# Patient Record
Sex: Male | Born: 1970 | Race: Black or African American | Hispanic: No | Marital: Single | Smoking: Current every day smoker
Health system: Southern US, Community
[De-identification: ages and names within clinical notes are randomized; demographics above are authoritative.]

## PROBLEM LIST (undated history)

## (undated) HISTORY — PX: HERNIA REPAIR: SHX51

---

## 2013-01-31 ENCOUNTER — Encounter (HOSPITAL_COMMUNITY): Payer: Self-pay | Admitting: *Deleted

## 2013-01-31 ENCOUNTER — Emergency Department (HOSPITAL_COMMUNITY)
Admission: EM | Admit: 2013-01-31 | Discharge: 2013-01-31 | Disposition: A | Discharge status: Home / Self Care | Payer: Self-pay | Attending: Emergency Medicine | Admitting: Emergency Medicine

## 2013-01-31 DIAGNOSIS — L0501 Pilonidal cyst with abscess: Secondary | ICD-10-CM | POA: Insufficient documentation

## 2013-01-31 DIAGNOSIS — R7309 Other abnormal glucose: Secondary | ICD-10-CM | POA: Insufficient documentation

## 2013-01-31 DIAGNOSIS — F17200 Nicotine dependence, unspecified, uncomplicated: Secondary | ICD-10-CM | POA: Insufficient documentation

## 2013-01-31 LAB — BASIC METABOLIC PANEL
BUN: 7 mg/dL (ref 6–23)
GFR calc Af Amer: >90 mL/min (ref >90)
GFR calc non Af Amer: >90 mL/min (ref >90)
Potassium: 3.6 mEq/L (ref 3.5–5.1)
Sodium: 134 mEq/L — ABNORMAL LOW (ref 135–145)

## 2013-01-31 LAB — CBC WITH DIFFERENTIAL/PLATELET
Basophils Absolute: 0.1 10*3/uL (ref 0.0–0.1)
Basophils Relative: 0 % (ref 0–1)
Eosinophils Absolute: 0.2 10*3/uL (ref 0.0–0.7)
MCH: 29 pg (ref 26.0–34.0)
MCHC: 33.8 g/dL (ref 30.0–36.0)
Neutrophils Relative %: 73 % (ref 43–77)
Platelets: 393 10*3/uL (ref 150–400)
RDW: 12.6 % (ref 11.5–15.5)

## 2013-01-31 MED ORDER — LIDOCAINE-EPINEPHRINE 2 %-1:100000 IJ SOLN: 30.0000 mL | Freq: Once | INTRAMUSCULAR | Status: DC

## 2013-01-31 MED ORDER — MORPHINE SULFATE 4 MG/ML IJ SOLN
4.0000 mg | Freq: Once | INTRAMUSCULAR | Status: AC
  Administered 2013-01-31: 4 mg via INTRAVENOUS
  Filled 2013-01-31: qty 1

## 2013-01-31 MED ORDER — LIDOCAINE-EPINEPHRINE 1 %-1:100000 IJ SOLN
30.0000 mL | Freq: Once | INTRAMUSCULAR | Status: AC
  Administered 2013-01-31: 30 mL
  Filled 2013-01-31: qty 1

## 2013-01-31 MED ORDER — OXYCODONE-ACETAMINOPHEN 5-325 MG PO TABS: 1.0000 | ORAL_TABLET | ORAL | Status: DC | PRN

## 2013-01-31 MED ORDER — DOXYCYCLINE HYCLATE 50 MG PO CAPS: 50.0000 mg | ORAL_CAPSULE | Freq: Two times a day (BID) | ORAL | Status: DC

## 2013-01-31 NOTE — ED Notes (Signed)
Pt has large significant abscess to left inner buttocks area

## 2013-01-31 NOTE — ED Provider Notes (Signed)
History    CSN: [id number] Arrival date & time 01/31/13  1520  First MD Initiated Contact with Patient 01/31/13 1540     Chief Complaint  Patient presents with  . Abscess   (Consider location/radiation/quality/duration/timing/severity/associated sxs/prior Treatment) HPI Comments: Pt presents with left buttock abscess present for about 2 weeks. Lesion has grown larger and much more tender over the last two days. The lesion is firm with a central blister. Pt does not think it has had any spontaneous bleeding or drainage. He has taken ibuprofen for pain with some partial relief. Pt denies fever but endorses chills. Otherwise feels well. No N/V, change in bowel/bladder habits, abdominal pain, chest pain, SOB, cough, or production of sputum. Pt has no known medical history otherwise. Pt states about 10-12 years ago, he was sitting on a couch and got "stuck by a spring," then developed a similar abscess that needed incision and drainage, but has had no problems, since then.  The history is provided by the patient. No language interpreter was used.   History reviewed. No pertinent past medical history. Past Surgical History  Procedure Laterality Date  . Hernia repair     No family history on file. History  Substance Use Topics  . Smoking status: Current Every Day Smoker  . Smokeless tobacco: Not on file  . Alcohol Use: Yes     Comment: occ    Review of Systems  Constitutional: Positive for chills. Negative for fever, activity change and appetite change.  HENT: Negative for congestion, sore throat and rhinorrhea.   Eyes: Negative for pain and itching.  Respiratory: Negative for cough, chest tightness, shortness of breath and wheezing.   Cardiovascular: Negative for chest pain, palpitations and leg swelling.  Gastrointestinal: Negative for nausea, vomiting, abdominal pain, constipation, blood in stool and abdominal distention.  Endocrine: Negative for polydipsia and polyuria.    Genitourinary: Negative for dysuria, urgency, hematuria and flank pain.  Musculoskeletal: Positive for back pain. Negative for myalgias and joint swelling.       Pain around abscess  Skin:       Abscess to buttock, per HPI  Allergic/Immunologic: Negative for environmental allergies.  Neurological: Negative for dizziness, syncope, weakness, light-headedness and numbness.  Psychiatric/Behavioral: Negative for behavioral problems, confusion and sleep disturbance.    Allergies  Review of patient's allergies indicates no known allergies.  Home Medications   Current Outpatient Rx  Name  Route  Sig  Dispense  Refill  . doxycycline (VIBRAMYCIN) 50 MG capsule   Oral   Take 1 capsule (50 mg total) by mouth 2 (two) times daily.   20 capsule   0   . oxyCODONE-acetaminophen (PERCOCET/ROXICET) 5-325 MG per tablet   Oral   Take 1 tablet by mouth every 4 (four) hours as needed for pain.   30 tablet   0    BP 125/87  Pulse 105  Temp(Src) 99.3 F (37.4 C) (Oral)  Resp 24  SpO2 98% Physical Exam  Nursing note and vitals reviewed. Constitutional: He is oriented to person, place, and time. He appears well-developed and well-nourished. No distress.  HENT:  Head: Normocephalic and atraumatic.  Eyes: Conjunctivae are normal. Pupils are equal, round, and reactive to light.  Neck: Normal range of motion. Neck supple.  Cardiovascular: Normal rate, regular rhythm and normal heart sounds.   No murmur heard. Pulmonary/Chest: Effort normal and breath sounds normal. No respiratory distress. He has no wheezes.  Abdominal: Soft. Bowel sounds are normal. He exhibits no distension.  There is no tenderness.  Musculoskeletal: Normal range of motion. He exhibits no edema and no tenderness.  Neurological: He is alert and oriented to person, place, and time. No cranial nerve deficit. He exhibits normal muscle tone.  Skin: Skin is warm and dry. Lesion noted. He is not diaphoretic.     Psychiatric: He has  a normal mood and affect. His behavior is normal.   ED Course  INCISION AND DRAINAGE Date/Time: 01/31/2013 5:47 PM Performed by: Bobbye Morton Authorized by: Bobbye Morton Consent: Verbal consent obtained. Risks and benefits: risks, benefits and alternatives were discussed Consent given by: patient Patient understanding: patient states understanding of the procedure being performed Patient consent: the patient's understanding of the procedure matches consent given Procedure consent: procedure consent matches procedure scheduled Patient identity confirmed: verbally with patient and arm band Time out: Immediately prior to procedure a "time out" was called to verify the correct patient, procedure, equipment, support staff and site/side marked as required. Type: abscess Body area: anogenital Location details: pilonidal Anesthesia: local infiltration Local anesthetic: lidocaine 2% with epinephrine Patient sedated: no Scalpel size: 11 Needle gauge: 22 Incision type: single straight Complexity: simple Drainage: purulent and bloody Drainage amount: copious Wound treatment: wound left open Packing material: 1/2 in iodoform gauze (approximately 5 inches) Patient tolerance: Patient tolerated the procedure well with no immediate complications.   (including critical care time) Labs Reviewed  CBC WITH DIFFERENTIAL - Abnormal; Notable for the following:    WBC 14.3 (*)    Neutro Abs 10.4 (*)    All other components within normal limits  BASIC METABOLIC PANEL - Abnormal; Notable for the following:    Sodium 134 (*)    Glucose, Bld 222 (*)    All other components within normal limits   No results found. 1. Pilonidal abscess     MDM  42yo male with pilonidal cyst as described above. S/p I&D in the ED with no immediate complication. Instructed to f/u in 2 days at urgent care for wound check and packing removal. Rx for doxycycline for 10 days and Percocet PRN for pain.  Work note provided. Return precautions discussed. OF NOTE: pt was found to be hyperglycemic, but without any other symptoms. Recommended establishing care with a PCP in the area for further evaluation.  The above was discussed in its entirety with attending ED physician Dr. Denton Lank.   Bobbye Morton, MD  PGY-2, [facility]  Bobbye Morton, MD 01/31/13 [phone]

## 2013-02-02 ENCOUNTER — Encounter (HOSPITAL_COMMUNITY): Payer: Self-pay | Admitting: Emergency Medicine

## 2013-02-02 ENCOUNTER — Emergency Department (INDEPENDENT_AMBULATORY_CARE_PROVIDER_SITE_OTHER)
Admission: EM | Admit: 2013-02-02 | Discharge: 2013-02-02 | Disposition: A | Discharge status: Home / Self Care | Payer: Medicaid Other | Source: Home / Self Care | Attending: Emergency Medicine | Admitting: Emergency Medicine

## 2013-02-02 DIAGNOSIS — L0291 Cutaneous abscess, unspecified: Secondary | ICD-10-CM

## 2013-02-02 DIAGNOSIS — L0390 Cellulitis, unspecified: Secondary | ICD-10-CM

## 2013-02-02 MED ORDER — OXYCODONE-ACETAMINOPHEN 5-325 MG PO TABS: ORAL_TABLET | ORAL | Status: DC

## 2013-02-02 NOTE — ED Notes (Signed)
Pt is here for packing removal from his buttocks,  Packing was placed in the main ED 01/30/2013

## 2013-02-02 NOTE — ED Provider Notes (Signed)
I saw and evaluated the patient, reviewed the resident's note and I agree with the findings and plan. Pt with large buttock abscess, left buttock. Resident I and D'd, I was present throughout the procedure.  Recheck no remaining fluctuance. No necrotic tissue. No crepitus.   Suzi Roots, MD 02/02/13 [phone]

## 2013-02-02 NOTE — ED Provider Notes (Signed)
Chief Complaint:   Chief Complaint  Patient presents with  . Abscess    History of Present Illness:    Anthony Weiss is a 42 year old male who returns for removal of packing from an abscess on his buttock. This was incised and drained this past Monday and he has been on doxycycline ever since then. He feels it is improving and is less painful. The wound was not cultured. He has been leaving the packing in place and dressing it daily. He denies any fever or chills. No prior history of skin infections or MRSA.  Review of Systems:  Other than noted above, the patient denies any of the following symptoms: Systemic:  No fever, chills or sweats. Skin:  No rash or itching.  PMFSH:  Past medical history, family history, social history, meds, and allergies were reviewed.  No history of diabetes or prior history of abscesses or MRSA.   Physical Exam:   Vital signs:  BP 124/85  Pulse 111  Temp(Src) 98.4 F (36.9 C) (Oral)  Resp 22  SpO2 100% Skin:  There is an abscess on the buttock, adjacent to the intergluteal cleft on the right side. This has some packing in place. There is surrounding induration and tenderness to palpation.  Skin exam was otherwise normal.  No rash. Ext:  Distal pulses were full, patient has full ROM of all joints.  Procedure:  Verbal informed consent was obtained.  The patient was informed of the risks and benefits of the procedure and understands and accepts.  Identity of the patient was verified verbally and by wristband. The packing was removed. Only a small strip of packing remains. The wound cavity was flushed with saline, antibiotic ointment was applied and a sterile dressing. He was instructed in wound care.  Assessment:  The encounter diagnosis was Abscess.  This appears to be healing up well. Does not need to be repacked.  Plan:   1.  The following meds were prescribed:   Discharge Medication List as of 02/02/2013 12:07 PM    START taking these medications   Details  !! oxyCODONE-acetaminophen (PERCOCET) 5-325 MG per tablet 1 to 2 tablets every 6 hours as needed for pain., Print     !! - Potential duplicate medications found. Please discuss with provider.     2.  The patient was instructed in symptomatic care and handouts were given. 3.  The patient was instructed in wound care.  Given red flag symptoms such as worsening pain, swelling, or fever that would indicate earlier return.   Reuben Likes, MD 02/02/13 [phone]

## 2015-02-07 ENCOUNTER — Emergency Department (HOSPITAL_COMMUNITY): Payer: Commercial Managed Care - PPO

## 2015-02-07 ENCOUNTER — Encounter (HOSPITAL_COMMUNITY): Payer: Self-pay | Admitting: Emergency Medicine

## 2015-02-07 ENCOUNTER — Emergency Department (HOSPITAL_COMMUNITY)
Admission: EM | Admit: 2015-02-07 | Discharge: 2015-02-07 | Disposition: A | Discharge status: Home / Self Care | Payer: Commercial Managed Care - PPO | Attending: Emergency Medicine | Admitting: Emergency Medicine

## 2015-02-07 DIAGNOSIS — M79671 Pain in right foot: Secondary | ICD-10-CM | POA: Diagnosis present

## 2015-02-07 DIAGNOSIS — Z720 Tobacco use: Secondary | ICD-10-CM | POA: Diagnosis not present

## 2015-02-07 DIAGNOSIS — Z792 Long term (current) use of antibiotics: Secondary | ICD-10-CM | POA: Insufficient documentation

## 2015-02-07 MED ORDER — NAPROXEN 375 MG PO TABS: 375.0000 mg | ORAL_TABLET | Freq: Two times a day (BID) | ORAL | Status: DC

## 2015-02-07 NOTE — ED Provider Notes (Signed)
CSN: [id number]     Arrival date & time 02/07/15  1407 History  This chart was scribed for non-physician practitioner, Roxy Horseman, PA-C, working with Blane Ohara, MD by Charline Bills, ED Scribe. This patient was seen in room TR10C/TR10C and the patient's care was started at 2:37 PM.   Chief Complaint  Patient presents with  . Foot Pain   The history is provided by the patient. No language interpreter was used.   HPI Comments: Anthony Weiss is a 44 y.o. male who presents to the Emergency Department with a chief complaint of gradually worsening, intermittent right foot pain for the past year. Pt initially noticed the pain 1 year ago when he tripped down stairs in slide-on shoes. Pain is exacerbated with palpation and bearing weight. He also reports associated intermittent swelling to the affected area. Pt has tried Aleve and Tylenol without significant relief.   History reviewed. No pertinent past medical history. Past Surgical History  Procedure Laterality Date  . Hernia repair     No family history on file. History  Substance Use Topics  . Smoking status: Current Every Day Smoker -- 0.50 packs/day    Types: Cigarettes  . Smokeless tobacco: Not on file  . Alcohol Use: Yes     Comment: occ    Review of Systems  Constitutional: Negative for fever and chills.  Respiratory: Negative for shortness of breath.   Cardiovascular: Negative for chest pain.  Gastrointestinal: Negative for nausea, vomiting, diarrhea and constipation.  Genitourinary: Negative for dysuria.  Musculoskeletal: Positive for joint swelling and arthralgias.   Allergies  Review of patient's allergies indicates no known allergies.  Home Medications   Prior to Admission medications   Medication Sig Start Date End Date Taking? Authorizing Provider  doxycycline (VIBRAMYCIN) 50 MG capsule Take 1 capsule (50 mg total) by mouth 2 (two) times daily. 01/31/13   Stephanie Coup Street, MD  oxyCODONE-acetaminophen  (PERCOCET) 5-325 MG per tablet 1 to 2 tablets every 6 hours as needed for pain. 02/02/13   Reuben Likes, MD  oxyCODONE-acetaminophen (PERCOCET/ROXICET) 5-325 MG per tablet Take 1 tablet by mouth every 4 (four) hours as needed for pain. 01/31/13   Stephanie Coup Street, MD   BP 133/83 mmHg  Pulse 86  Temp(Src) 98 F (36.7 C) (Oral)  Resp 18  SpO2 99% Physical Exam  Constitutional: He is oriented to person, place, and time. He appears well-developed and well-nourished. No distress.  HENT:  Head: Normocephalic and atraumatic.  Eyes: Conjunctivae and EOM are normal.  Neck: Neck supple. No tracheal deviation present.  Cardiovascular: Normal rate and intact distal pulses.   Brisk cap refill.   Pulmonary/Chest: Effort normal. No respiratory distress.  Musculoskeletal: Normal range of motion.  R foot: moderate tenderness to palpation over lateral aspect. No bony abnormality or deformity. No signs of infectoion  or abscess. No erythema, warmth or sign of gout. ROM and strength 5/5. Able to ambulate.   Neurological: He is alert and oriented to person, place, and time.  Skin: Skin is warm and dry.  Psychiatric: He has a normal mood and affect. His behavior is normal.  Nursing note and vitals reviewed.  ED Course  Procedures (including critical care time) DIAGNOSTIC STUDIES: Oxygen Saturation is 99% on RA, normal by my interpretation.    COORDINATION OF CARE: 2:40 PM-Discussed treatment plan which includes XR with pt at bedside and pt agreed to plan.   Labs Review Labs Reviewed - No data to display  Imaging  Review Dg Foot Complete Right  02/07/2015   CLINICAL DATA:  Right foot swelling after fall down steps 1 year ago. Initial encounter.  EXAM: RIGHT FOOT COMPLETE - 3+ VIEW  COMPARISON:  None.  FINDINGS: There is no evidence of fracture or dislocation. There is no evidence of arthropathy or other focal bone abnormality. Soft tissues are unremarkable.  IMPRESSION: Normal right foot.    Electronically Signed   By: Lupita Raider, M.D.   On: 02/07/2015 15:42    EKG Interpretation None      MDM   Final diagnoses:  Right foot pain    Patient with right sided foot pain. Plain films are negative. Pain has been persistent for the past year. No evidence of infection. Intact distal pulses and brisk capillary refill. Will recommend outpatient follow-up with podiatry or orthopedics. Patient understands and agrees with the plan. He is stable and ready for discharge.  I personally performed the services described in this documentation, which was scribed in my presence. The recorded information has been reviewed and is accurate.     Troy Kanouse, PA-C 02/07/15 1605  Blane Ohara, MD 02/07/15 [phone]

## 2015-02-07 NOTE — Discharge Instructions (Signed)
Your x-rays in the emergency department are unremarkable. Please follow-up with a foot specialist.   Emergency Department Resource Guide 1) Find a Doctor and Pay Out of Pocket Although you won't have to find out who is covered by your insurance plan, it is a good idea to ask around and get recommendations. You will then need to call the office and see if the doctor you have chosen will accept you as a new patient and what types of options they offer for patients who are self-pay. Some doctors offer discounts or will set up payment plans for their patients who do not have insurance, but you will need to ask so you aren't surprised when you get to your appointment.  2) Contact Your Local Health Department Not all health departments have doctors that can see patients for sick visits, but many do, so it is worth a call to see if yours does. If you don't know where your local health department is, you can check in your phone book. The CDC also has a tool to help you locate your state's health department, and many state websites also have listings of all of their local health departments.  3) Find a Walk-in Clinic If your illness is not likely to be very severe or complicated, you may want to try a walk in clinic. These are popping up all over the country in pharmacies, drugstores, and shopping centers. They're usually staffed by nurse practitioners or physician assistants that have been trained to treat common illnesses and complaints. They're usually fairly quick and inexpensive. However, if you have serious medical issues or chronic medical problems, these are probably not your best option.  No Primary Care Doctor: - Call Health Connect at  [phone] - they can help you locate a primary care doctor that  accepts your insurance, provides certain services, etc. - Physician Referral Service- [phone]  Chronic Pain Problems: Organization         Address  Phone   Notes  [facility]  [phone] Patients need to be referred by their primary care doctor.   Medication Assistance: Organization         Address  Phone   Notes  [facility] Medication [facility] [address] [place] [place], [place] [address] [phone] --Must be a resident of [facility] -- Must have NO insurance coverage whatsoever (no Medicaid/ Medicare, etc.) -- The pt. MUST have a primary care doctor that directs their care regularly and follows them in the community   [facility]ist  [phone]   [employer]  [phone]    Agencies that provide inexpensive medical care: Organization         Address  Phone   Notes  [facility]  [phone]   [facility]    [phone]   [facility] [address] [place], [place] [address] [phone]   [facility] [address] [place] [place] [phone]   Planned Parenthood    [phone]   [facility]    [phone]   [facility]  [address], [place] Phone:  [phone], Fax:  [phone] Hours of Operation:  9 am - 6 pm, M-F.  Also accepts Medicaid/Medicare and self-pay.  [facility]  [address], [place] Phone: [phone], Fax: [phone]. Hours of Operation:  8:30  am - 5:30 pm, M-F.  Also accepts Medicaid and self-pay.  [facility] [place] [address], [place] Phone: [phone]) [phone]   [facility] [address], [place], [place] [phone], Ext. [phone] Mondays & Thursdays: 7-9 AM.  First 15 patients are seen on a first come, first serve basis.    Medicaid-accepting [facility] Providers:  Organization         Address  Phone   Notes  [facility] [address], [place] [phone] Also accepts self-pay patients.  [facility] [address],  [place]  [phone]   [facility] [address], [place] [phone](336) [phone]   [facility] [address] [place] [address], [place] [phone]) [phone]   Renaye RakersVeita Bland [address], [place]   [phone] Only accepts [place] Access [place] patients after they have their name applied to their card.   Self-Pay (no insurance) in [place] [facility]:  Organization         Address  Phone   Notes  Sickle Cell Patients, [facility] [address], [place] [phone](336) [phone]   [facility] [address], [place] [phone]   [facility] [place]  [address], [place] [phone]) [phone]   [facility]/Dr. Osei-Bonsu  [address] [place] [address], [place] or [phone], [place] [phone]) [phone] Phone number for both [place] and [place] locations is the same.  [facility] [address], [place] [phone](336) [phone]   [facility] [address] [place] [address], [place] or [address] [phone](336) [phone]   [facility] [address], [place] [phone]) [phone], phone; [phone](336) [phone], fax Sees patients 1st and 3rd Saturday of every month.  Must not qualify for public or private insurance (i.e. Medicaid, Medicare, [place] Health Choice, Veterans' Benefits)  Household income should be no more than 200% of the poverty level The clinic cannot treat you if you are pregnant or think you are pregnant  Sexually transmitted diseases are not treated at the clinic.    Dental Care: Organization         Address  Phone  Notes  [facility] of [facility] [address], [place] [phone] Accepts children up to age 45 who are enrolled in [place]icaid or [place] Health Choice; pregnant women with a Medicaid card; and children who have applied for Medicaid or [facility] [facility]  Choice, but were declined, whose parents can pay a reduced fee at time of service.  [facility] [place]  [address], [place] [phone] Accepts children up to age 45 who are enrolled in [place]icaid or [place] Health Choice; pregnant women with a Medicaid card; and children who have applied for Medicaid or [place] Health Choice, but were declined, whose parents can pay a reduced fee at time of service.  [facility]  [address], [place] [phone]) [phone] Patients are seen by appointment only. Walk-ins are not accepted. [facility] will see patients 45 years of age and older. Monday - Tuesday (8am-5pm) Most Wednesdays (8:30-5pm) $30 per visit, cash only  [facility]  [address], [facility] [phone]) [phone] Patients are seen by appointment only.  Walk-ins are not accepted. [facility] will see patients 45 years of age and older. One Wednesday Evening (Monthly: Volunteer Based).  $30 per visit, cash only  [employer]  [phone] for adults; Children under age 484, call Graduate Pediatric Dentistry at [phone]. Children aged 674-14, please call [phone] to request a pediatric application.  Dental services are provided in all areas of dental care including fillings, crowns and bridges, complete and partial dentures, implants, gum treatment, root canals, and extractions. Preventive care is also provided. Treatment is provided to both adults and children. Patients are selected via a lottery and there is often a waiting list.   [facility] [address], [place]  [phone]) [phone] [email or web address]   [facility] [address], [place], [place] [phone], Ext. [phone] Second and Fourth Thursday of each month, opens at 6:30 AM; Clinic ends at 9 AM.  Patients are seen on a first-come first-served basis, and a limited number are seen during each  clinic.   [facility]  [address], [place], [place] [phone]) [phone]   Eligibility Requirements You must have lived in [place], [place], or [place] counties for at least the last three months.   You cannot be eligible for state or federal sponsored National Cityhealthcare insurance, including CIGNAVeterans Administration, [place]icaid, or [employer]Medicare.   You generally cannot be eligible for healthcare insurance through your employer.    How to apply: Eligibility screenings are held every Tuesday and Wednesday afternoon from 1:00 pm until 4:00 pm. You do not need an appointment for the interview!  [facility] [address], [place], [place] [phone]   [facility]  [phone]   [facility] [facility]  [phone]   [facility]  [phone]    Behavioral Health Resources in the Community: Intensive Outpatient Programs Organization         Address  Phone  Notes  [facility] [address], [place], [place] [phone]   [facility] [address], [place], [place] [phone]   [facility] [address], [place], [place]  [phone]   [facility] [address],  [place], [place] [phone] or [phone]   Substance Abuse Resources Organization         Address  Phone  Notes  [facility]  [phone]   [facility]  [phone]   [facility]  [phone]   [facility]  [phone]   Residential & Outpatient Substance Abuse Program  [phone]   Psychological Services Organization         Address  Phone  Notes  [facility]  [phone]- [phone]   [facility]  [phone]- [phone]   [facility] [address], [place] [phone] or [phone]    Mobile Crisis Teams Organization         Address  Phone  Notes  [facility], Mobile  Crisis Care Unit  [phone]   [facility]  [address] [place], [place] [phone]   Doristine LocksSharon DeEsch [address] [place] [phone]    Self-Help/Support Groups Organization         Address  Phone             Notes  [facility] [place] - variety of support groups  336- [phone] Call for more information  Narcotics  Anonymous (NA), Caring Services [address], [employer] [place]  2 meetings at this location   Residential Sports administrator         Address  Phone  Notes  [facility] [address],    [place]  [phone]   [facility]  [address] [place] [address], [place], [place] [phone]   [facility] [address] [place], [place] [phone] Admissions: 8am-3pm M-F  [facility] [address],    [place], [place] [phone]   [facility] [address] [place], [place], [place] [phone]   [facility] [address]  [place], [place] [phone]   [facility] - Intensive Outpatient [address], [place], [place] [phone]   [facility] [address]  [place], [place] [phone] or [phone]   [facility] ([facility]) [address], [place], [place] [phone] Accepts Medicaid  [facility] [place] [address],  [place] [phone] Substance Abuse/Addiction Treatment   [facility] Organization         Address  Phone  Notes  [facility]  [phone]   Angie Fava, PhD [address] [place], [place]   [phone] or [phone]   [facility]   [address] [place], [place] [phone]   [facility] [address], [place], [place] [phone] Insurance/Medicaid/sponsorship through [facility] and Families [address]                                    [place], [place] [phone] Therapy/tele-psych/case  [facility] [address], [place] [phone]    Dr. Lolly Mustache  [phone]   [facility]  [employer] [facility] Dept. 1) [address] [place] 2) [address] [place] 3)  [address], [place] [phone]  [phone]   [facility] Child Abuse Hotline [phone] or [phone] (After Hours)

## 2015-02-07 NOTE — ED Notes (Signed)
Patient states R foot pain and swelling for 1 year after tripping on steps when dog pulled him quickly.  Patient states still has pain and swelling with same.   Patient states he takes aleve and tylenol at home without a lot of relief.

## 2015-12-09 ENCOUNTER — Emergency Department (HOSPITAL_COMMUNITY)
Admission: EM | Admit: 2015-12-09 | Discharge: 2015-12-09 | Disposition: A | Discharge status: Home / Self Care | Payer: Commercial Managed Care - PPO | Attending: Emergency Medicine | Admitting: Emergency Medicine

## 2015-12-09 ENCOUNTER — Emergency Department (HOSPITAL_COMMUNITY): Payer: Commercial Managed Care - PPO

## 2015-12-09 ENCOUNTER — Encounter (HOSPITAL_COMMUNITY): Payer: Self-pay

## 2015-12-09 DIAGNOSIS — Z791 Long term (current) use of non-steroidal anti-inflammatories (NSAID): Secondary | ICD-10-CM | POA: Diagnosis not present

## 2015-12-09 DIAGNOSIS — Z79899 Other long term (current) drug therapy: Secondary | ICD-10-CM | POA: Diagnosis not present

## 2015-12-09 DIAGNOSIS — F17210 Nicotine dependence, cigarettes, uncomplicated: Secondary | ICD-10-CM | POA: Insufficient documentation

## 2015-12-09 DIAGNOSIS — R079 Chest pain, unspecified: Secondary | ICD-10-CM | POA: Insufficient documentation

## 2015-12-09 DIAGNOSIS — M79672 Pain in left foot: Secondary | ICD-10-CM

## 2015-12-09 DIAGNOSIS — R1013 Epigastric pain: Secondary | ICD-10-CM | POA: Diagnosis present

## 2015-12-09 LAB — BASIC METABOLIC PANEL
Anion gap: 9 (ref 5–15)
BUN: 12 mg/dL (ref 6–20)
CALCIUM: 9.5 mg/dL (ref 8.9–10.3)
CHLORIDE: 104 mmol/L (ref 101–111)
CO2: 25 mmol/L (ref 22–32)
CREATININE: 0.73 mg/dL (ref 0.61–1.24)
Glucose, Bld: 117 mg/dL — ABNORMAL HIGH (ref 65–99)
Potassium: 3.8 mmol/L (ref 3.5–5.1)
SODIUM: 138 mmol/L (ref 135–145)

## 2015-12-09 LAB — LIPASE, BLOOD: Lipase: 28 U/L (ref 11–51)

## 2015-12-09 LAB — HEPATIC FUNCTION PANEL
ALBUMIN: 4.2 g/dL (ref 3.5–5.0)
ALK PHOS: 77 U/L (ref 38–126)
ALT: 15 U/L — AB (ref 17–63)
AST: 18 U/L (ref 15–41)
BILIRUBIN TOTAL: 1.2 mg/dL (ref 0.3–1.2)
Bilirubin, Direct: 0.1 mg/dL (ref 0.1–0.5)
Indirect Bilirubin: 1.1 mg/dL — ABNORMAL HIGH (ref 0.3–0.9)
TOTAL PROTEIN: 7.4 g/dL (ref 6.5–8.1)

## 2015-12-09 LAB — CBC
HCT: 44.3 % (ref 39.0–52.0)
Hemoglobin: 14.2 g/dL (ref 13.0–17.0)
MCH: 28.3 pg (ref 26.0–34.0)
MCHC: 32.1 g/dL (ref 30.0–36.0)
MCV: 88.2 fL (ref 78.0–100.0)
PLATELETS: 393 10*3/uL (ref 150–400)
RBC: 5.02 MIL/uL (ref 4.22–5.81)
RDW: 13 % (ref 11.5–15.5)
WBC: 10.7 10*3/uL — AB (ref 4.0–10.5)

## 2015-12-09 LAB — I-STAT TROPONIN, ED: TROPONIN I, POC: 0 ng/mL (ref 0.00–0.08)

## 2015-12-09 MED ORDER — OMEPRAZOLE 20 MG PO CPDR: 20.0000 mg | DELAYED_RELEASE_CAPSULE | Freq: Every day | ORAL | Status: DC

## 2015-12-09 MED ORDER — SUCRALFATE 1 G PO TABS: 1.0000 g | ORAL_TABLET | Freq: Three times a day (TID) | ORAL | Status: DC | PRN

## 2015-12-09 MED ORDER — PANTOPRAZOLE SODIUM 40 MG IV SOLR
40.0000 mg | Freq: Once | INTRAVENOUS | Status: AC
  Administered 2015-12-09: 40 mg via INTRAVENOUS
  Filled 2015-12-09: qty 40

## 2015-12-09 MED ORDER — SODIUM CHLORIDE 0.9 % IV BOLUS (SEPSIS)
1000.0000 mL | Freq: Once | INTRAVENOUS | Status: AC
  Administered 2015-12-09: 1000 mL via INTRAVENOUS

## 2015-12-09 MED ORDER — GI COCKTAIL ~~LOC~~
30.0000 mL | Freq: Once | ORAL | Status: AC
  Administered 2015-12-09: 30 mL via ORAL
  Filled 2015-12-09: qty 30

## 2015-12-09 NOTE — Discharge Instructions (Signed)
Please call GI and orthopedics to schedule a follow up appointment as soon as possible. Take medications as prescribed. Return to the ER for new or worsening symptoms.

## 2015-12-09 NOTE — ED Provider Notes (Signed)
CSN: [id number]     Arrival date & time 12/09/15  1506 History   First MD Initiated Contact with Patient 12/09/15 1700     Chief Complaint  Patient presents with  . Chest Pain    HPI  Anthony Weiss is an 45 y.o. male with no significant PMH who presents to the ED for evaluation of epigastric pain. He states he started experiencing the pain about two weeks ago. He states it is constant but fluctuates in severity. He describes the pain as burning. He reports associated nausea with intermittent emesis (denies nausea currently). States the pain is worse after eating. He has not tried anything to alleviate his symptoms. Denies radiation of the pain. Denies prior similar symptoms. Denies h/o GERD or PUD. He admits to smoking 1/2 PPD and drinking 2-3 beers nightly.   He is also complaining of pain int he plantar aspect of his  Left foot. He states the pain has been there for about one month since he accidentally cut himself with a razor. He statest hat since then the skin keeps getting thicker and he has pain with pressure and ambulation. Denies redness or drainage. Denies swelling.   History reviewed. No pertinent past medical history. Past Surgical History  Procedure Laterality Date  . Hernia repair     No family history on file. Social History  Substance Use Topics  . Smoking status: Current Every Day Smoker -- 0.50 packs/day    Types: Cigarettes  . Smokeless tobacco: None  . Alcohol Use: Yes     Comment: occ    Review of Systems  All other systems reviewed and are negative.     Allergies  Review of patient's allergies indicates no known allergies.  Home Medications   Prior to Admission medications   Medication Sig Start Date End Date Taking? Authorizing Provider  doxycycline (VIBRAMYCIN) 50 MG capsule Take 1 capsule (50 mg total) by mouth 2 (two) times daily. 01/31/13   Stephanie Couphristopher M Street, MD  naproxen (NAPROSYN) 375 MG tablet Take 1 tablet (375 mg total) by mouth 2 (two)  times daily. 02/07/15   Roxy Horsemanobert Browning, PA-C  oxyCODONE-acetaminophen (PERCOCET) 5-325 MG per tablet 1 to 2 tablets every 6 hours as needed for pain. 02/02/13   Reuben Likesavid C Keller, MD  oxyCODONE-acetaminophen (PERCOCET/ROXICET) 5-325 MG per tablet Take 1 tablet by mouth every 4 (four) hours as needed for pain. 01/31/13   Stephanie Couphristopher M Street, MD   BP 132/87 mmHg  Pulse 89  Temp(Src) 98.3 F (36.8 C) (Oral)  Resp 20  Ht 6\' 2"  (1.88 m)  Wt 108.863 kg  BMI 30.80 kg/m2  SpO2 95% Physical Exam  Constitutional: He is oriented to person, place, and time.  HENT:  Right Ear: External ear normal.  Left Ear: External ear normal.  Nose: Nose normal.  Mouth/Throat: Oropharynx is clear and moist. No oropharyngeal exudate.  Eyes: Conjunctivae and EOM are normal. Pupils are equal, round, and reactive to light.  Neck: Normal range of motion. Neck supple.  Cardiovascular: Normal rate, regular rhythm, normal heart sounds and intact distal pulses.   Pulmonary/Chest: Effort normal and breath sounds normal. No respiratory distress. He has no wheezes.  Abdominal: Soft. Bowel sounds are normal. He exhibits no distension. There is tenderness in the epigastric area. There is no rebound and no guarding.  Musculoskeletal: He exhibits no edema.  plater aspect has 2cm region proximal to third and fourth toes of thickened, peeling skin. That area is diffusely ttp though there  is no erythema and no fluctuance palpable. 2+ dp, brisk cap refill.   Neurological: He is alert and oriented to person, place, and time. No cranial nerve deficit.  Skin: Skin is warm and dry.  Psychiatric: He has a normal mood and affect.  Nursing note and vitals reviewed.   ED Course  Procedures (including critical care time) Labs Review Labs Reviewed  BASIC METABOLIC PANEL - Abnormal; Notable for the following:    Glucose, Bld 117 (*)    All other components within normal limits  CBC - Abnormal; Notable for the following:    WBC 10.7 (*)     All other components within normal limits  HEPATIC FUNCTION PANEL - Abnormal; Notable for the following:    ALT 15 (*)    Indirect Bilirubin 1.1 (*)    All other components within normal limits  LIPASE, BLOOD  I-STAT TROPOININ, ED    Imaging Review Dg Chest 2 View  12/09/2015  CLINICAL DATA:  Central chest pain, shortness of breath, cough EXAM: CHEST  2 VIEW COMPARISON:  None. FINDINGS: The heart size and mediastinal contours are within normal limits. Both lungs are clear. The visualized skeletal structures are unremarkable. IMPRESSION: No active cardiopulmonary disease. Electronically Signed   By: Elige Ko   On: 12/09/2015 15:51   I have personally reviewed and evaluated these images and lab results as part of my medical decision-making.   EKG Interpretation None      MDM   Final diagnoses:  Epigastric pain  Left foot pain    Cardiac workup was ordered in triage as pt originally presented complaining of chest pain. Labs, CXR, and EKG negative for acute findings. On my evaluation verbalizes chest pain but the region where he points to and is tender at is actually his epigastrum. Will had hepatic function panel, lipase. Give fluids, GI cocktail, protonix.  Hep funtction panel and lipase negative. Symptoms resolved following GI cocktail and protonix. Doubt ACS or other emergent cardiopulmonary process. Suspect pt's symptoms likely GERD or PUD. Will give rx for PPI and carafate and referral to GI.  Foot x-ray negative for e/o osteomyelitis. No overlying cellulitis or signs of abscess on exam. Encouraged supportive therapies and f/u with ortho.     Carlene Coria, PA-C 12/10/15 1122  Lyndal Pulley, MD 12/11/15 [phone]

## 2015-12-09 NOTE — ED Notes (Signed)
Patient here with 2 weeks of chest pain. Describes as sharp twisting pain. No other associated symptoms. Also wants cut on left foot checked, cut occurred 1 month ago

## 2016-06-25 ENCOUNTER — Ambulatory Visit: Payer: Commercial Managed Care - PPO | Admitting: Podiatry

## 2016-07-10 ENCOUNTER — Encounter: Payer: Commercial Managed Care - PPO | Admitting: Podiatry

## 2016-07-11 NOTE — Progress Notes (Signed)
This encounter was created in error - please disregard.

## 2016-08-20 ENCOUNTER — Encounter (HOSPITAL_COMMUNITY): Payer: Self-pay

## 2016-08-20 ENCOUNTER — Emergency Department (HOSPITAL_COMMUNITY)
Admission: EM | Admit: 2016-08-20 | Discharge: 2016-08-20 | Disposition: A | Discharge status: Home / Self Care | Payer: Commercial Managed Care - PPO | Attending: Emergency Medicine | Admitting: Emergency Medicine

## 2016-08-20 DIAGNOSIS — F17210 Nicotine dependence, cigarettes, uncomplicated: Secondary | ICD-10-CM | POA: Insufficient documentation

## 2016-08-20 DIAGNOSIS — K219 Gastro-esophageal reflux disease without esophagitis: Secondary | ICD-10-CM | POA: Diagnosis not present

## 2016-08-20 LAB — URINALYSIS, ROUTINE W REFLEX MICROSCOPIC
Bilirubin Urine: NEGATIVE
GLUCOSE, UA: NEGATIVE mg/dL
Hgb urine dipstick: NEGATIVE
KETONES UR: 5 mg/dL — AB
LEUKOCYTES UA: NEGATIVE
Nitrite: NEGATIVE
PH: 5 (ref 5.0–8.0)
Protein, ur: NEGATIVE mg/dL
Specific Gravity, Urine: 1.021 (ref 1.005–1.030)

## 2016-08-20 LAB — CBC
HEMATOCRIT: 45.4 % (ref 39.0–52.0)
Hemoglobin: 15.1 g/dL (ref 13.0–17.0)
MCH: 29.8 pg (ref 26.0–34.0)
MCHC: 33.3 g/dL (ref 30.0–36.0)
MCV: 89.5 fL (ref 78.0–100.0)
Platelets: 370 10*3/uL (ref 150–400)
RBC: 5.07 MIL/uL (ref 4.22–5.81)
RDW: 12.8 % (ref 11.5–15.5)
WBC: 10 10*3/uL (ref 4.0–10.5)

## 2016-08-20 LAB — COMPREHENSIVE METABOLIC PANEL
ALBUMIN: 4 g/dL (ref 3.5–5.0)
ALT: 13 U/L — ABNORMAL LOW (ref 17–63)
AST: 18 U/L (ref 15–41)
Alkaline Phosphatase: 68 U/L (ref 38–126)
Anion gap: 12 (ref 5–15)
BUN: 9 mg/dL (ref 6–20)
CO2: 24 mmol/L (ref 22–32)
Calcium: 9.2 mg/dL (ref 8.9–10.3)
Chloride: 101 mmol/L (ref 101–111)
Creatinine, Ser: 0.88 mg/dL (ref 0.61–1.24)
GFR calc Af Amer: >60 mL/min (ref >60)
GFR calc non Af Amer: >60 mL/min (ref >60)
GLUCOSE: 125 mg/dL — AB (ref 65–99)
POTASSIUM: 4.2 mmol/L (ref 3.5–5.1)
SODIUM: 137 mmol/L (ref 135–145)
Total Bilirubin: 0.9 mg/dL (ref 0.3–1.2)
Total Protein: 6.8 g/dL (ref 6.5–8.1)

## 2016-08-20 LAB — LIPASE, BLOOD: LIPASE: 26 U/L (ref 11–51)

## 2016-08-20 MED ORDER — PANTOPRAZOLE SODIUM 20 MG PO TBEC: 20.0000 mg | DELAYED_RELEASE_TABLET | Freq: Two times a day (BID) | ORAL | 3 refills | Status: DC

## 2016-08-20 MED ORDER — GI COCKTAIL ~~LOC~~
30.0000 mL | Freq: Once | ORAL | Status: AC
  Administered 2016-08-20: 30 mL via ORAL
  Filled 2016-08-20: qty 30

## 2016-08-20 MED ORDER — SUCRALFATE 1 GM/10ML PO SUSP: 1.0000 g | Freq: Three times a day (TID) | ORAL | 0 refills | Status: DC

## 2016-08-20 MED ORDER — PANTOPRAZOLE SODIUM 40 MG PO TBEC
40.0000 mg | DELAYED_RELEASE_TABLET | Freq: Once | ORAL | Status: AC
  Administered 2016-08-20: 40 mg via ORAL
  Filled 2016-08-20: qty 1

## 2016-08-20 NOTE — ED Triage Notes (Signed)
Pt states that he started having abd pain and vomiting last night. Denies diarrhea, pt states that he is out of GERD medication.

## 2016-08-20 NOTE — ED Notes (Signed)
Pt is in stable condition upon d/c and ambulates from ED. 

## 2016-08-20 NOTE — ED Notes (Signed)
Pt did not need anything at this time  

## 2016-08-20 NOTE — Discharge Instructions (Signed)
Take Protonix twice daily before meals. Use the Carafate slurry up to 3 times a day as needed. Avoid ibuprofen, Advil, BC powders or Goody powders as this may worsen your symptoms. Decrease your alcohol intake. Schedule an appointment to see a gastroenterologist as soon as possible. Return to the emergency department if you experience significant worsening of her symptoms, severe abdominal pain, chest pain, difficulty breathing or swallowing, blood in your vomit or stool.

## 2016-08-20 NOTE — ED Provider Notes (Signed)
[facility]-EMERGENCY DEPT Provider Note   CSN: [id number] Arrival date & time: 08/20/16  0630     History   Chief Complaint Chief Complaint  Patient presents with  . Gastroesophageal Reflux    HPI Anthony Weiss is a 46 y.o. male . Past medical history of EtOH use, GERD who presents to the ED today complaining of epigastric pain and vomiting. Patient states that after the [place] Bowl game he began experiencing significant burning sensation in his epigastric area. Patient states he wasn't able to eat anything secondary to this pain. He had half of a 40 ounce of beer and then began vomiting. Emesis was nonbloody and nonbilious. Patient has been having ongoing burning from his throat down to his epigastrium since. Patient has had previous episodes of similar symptoms which she is seen in the emergency department for. He was prescribed a PPI which she has been taking but has since run out of this medication. Patient states that this did provide him symptomatic relief. He denies any fevers, chills, diarrhea, melena, hematochezia, chest pain, shortness of breath. He reports daily NSAID use. He takes 2-3 ibuprofen daily for arthralgias. He also reports daily alcohol use, minimum of one 40 ounce of beer each day. He is also current every day smoker. Patient has never had EGD.  HPI  History reviewed. No pertinent past medical history.  There are no active problems to display for this patient.   Past Surgical History:  Procedure Laterality Date  . HERNIA REPAIR         Home Medications    Prior to Admission medications   Medication Sig Start Date End Date Taking? Authorizing Provider  ibuprofen (ADVIL,MOTRIN) 200 MG tablet Take 200 mg by mouth every 6 (six) hours as needed.   Yes Historical Provider, MD  doxycycline (VIBRAMYCIN) 50 MG capsule Take 1 capsule (50 mg total) by mouth 2 (two) times daily. Patient not taking: Reported on 08/20/2016 01/31/13   Stephanie Couphristopher M Street, MD  naproxen (NAPROSYN)  375 MG tablet Take 1 tablet (375 mg total) by mouth 2 (two) times daily. Patient not taking: Reported on 08/20/2016 02/07/15   Roxy Horsemanobert Browning, PA-C  omeprazole (PRILOSEC) 20 MG capsule Take 1 capsule (20 mg total) by mouth daily. Patient not taking: Reported on 08/20/2016 12/09/15   Ace GinsSerena Y Sam, PA-C  oxyCODONE-acetaminophen (PERCOCET) 5-325 MG per tablet 1 to 2 tablets every 6 hours as needed for pain. Patient not taking: Reported on 08/20/2016 02/02/13   Reuben Likesavid C Keller, MD  oxyCODONE-acetaminophen (PERCOCET/ROXICET) 5-325 MG per tablet Take 1 tablet by mouth every 4 (four) hours as needed for pain. Patient not taking: Reported on 08/20/2016 01/31/13   Stephanie Couphristopher M Street, MD  sucralfate (CARAFATE) 1 g tablet Take 1 tablet (1 g total) by mouth 3 (three) times daily as needed. Patient not taking: Reported on 08/20/2016 12/09/15   Carlene CoriaSerena Y Sam, PA-C    Family History No family history on file.  Social History Social History  Substance Use Topics  . Smoking status: Current Every Day Smoker    Packs/day: 0.50    Types: Cigarettes  . Smokeless tobacco: Never Used  . Alcohol use Yes     Comment: occ     Allergies   Patient has no known allergies.   Review of Systems Review of Systems  All other systems reviewed and are negative.    Physical Exam Updated Vital Signs BP 134/87   Pulse 76   Temp 98 F (36.7 C)  Resp 18   Ht 6\' 2"  (1.88 m)   Wt 104.3 kg   SpO2 100%   BMI 29.53 kg/m   Physical Exam  Constitutional: He is oriented to person, place, and time. He appears well-developed and well-nourished. No distress.  HENT:  Head: Normocephalic and atraumatic.  Mouth/Throat: No oropharyngeal exudate.  Eyes: Conjunctivae and EOM are normal. Pupils are equal, round, and reactive to light. Right eye exhibits no discharge. Left eye exhibits no discharge. No scleral icterus.  Cardiovascular: Normal rate, regular rhythm, normal heart sounds and intact distal pulses.  Exam reveals no gallop  and no friction rub.   No murmur heard. Pulmonary/Chest: Effort normal and breath sounds normal. No respiratory distress. He has no wheezes. He has no rales. He exhibits no tenderness.  Abdominal: Soft. Bowel sounds are normal. He exhibits no distension and no mass. There is tenderness ( Over epigastrium). There is no rebound and no guarding.  Musculoskeletal: Normal range of motion. He exhibits no edema.  Neurological: He is alert and oriented to person, place, and time.  Skin: Skin is warm and dry. No rash noted. He is not diaphoretic. No erythema. No pallor.  Psychiatric: He has a normal mood and affect. His behavior is normal.  Nursing note and vitals reviewed.    ED Treatments / Results  Labs (all labs ordered are listed, but only abnormal results are displayed) Labs Reviewed  COMPREHENSIVE METABOLIC PANEL - Abnormal; Notable for the following:       Result Value   Glucose, Bld 125 (*)    ALT 13 (*)    All other components within normal limits  LIPASE, BLOOD  CBC  URINALYSIS, ROUTINE W REFLEX MICROSCOPIC    EKG  EKG Interpretation None       Radiology No results found.  Procedures Procedures (including critical care time)  Medications Ordered in ED Medications  gi cocktail (Maalox,Lidocaine,Donnatal) (not administered)  pantoprazole (PROTONIX) EC tablet 40 mg (not administered)     Initial Impression / Assessment and Plan / ED Course  I have reviewed the triage vital signs and the nursing notes.  Pertinent labs & imaging results that were available during my care of the patient were reviewed by me and considered in my medical decision making (see chart for details).     46 year old male with a past medical history of EtOH use, GERD presents to the ED with epigastric tenderness and vomiting. Pain is worsened with eating. He has history of similar symptoms and was told this was related to reflux. He was previosuly taking PPIs and Carafate which he has since run  out of. He states this relieved his symptoms. His labs are unremarkable. No evidence of pancreatitis. He denies any chest pain or shortness of breath. Vital signs are stable and he overall appears well. He was given GI cocktail and PO Protonix in the ED with significant symptomatic improvement. DDX includes PUD, alcoholic gastritis, esophagitis. Patient also reports heavy NSAID use. He denies any melena, hematochezia, hematemesis. Hemoglobin is stable. I will discharge him on Protonix twice a day and Carafate slurry. I recommended that he follow up with Gastroenterologist, referral is given. He would likely benefit from EGD in the future. Recommend discontinuation of NSAID use, may take Tylenol as needed for arthralgias. Return precautions outlined in patient discharge instructions.    Final Clinical Impressions(s) / ED Diagnoses   Final diagnoses:  Gastroesophageal reflux disease, esophagitis presence not specified    New Prescriptions Discharge Medication List as  of 08/20/2016  9:49 AM    START taking these medications   Details  pantoprazole (PROTONIX) 20 MG tablet Take 1 tablet (20 mg total) by mouth 2 (two) times daily., Starting Wed 08/20/2016, Print    sucralfate (CARAFATE) 1 GM/10ML suspension Take 10 mLs (1 g total) by mouth 4 (four) times daily -  with meals and at bedtime., Starting Wed 08/20/2016, Print         Lester Kinsman [place], PA-C 08/20/16 1610    Lyndal Pulley, MD 08/20/16 [phone]

## 2017-03-21 ENCOUNTER — Emergency Department (HOSPITAL_COMMUNITY)
Admission: EM | Admit: 2017-03-21 | Discharge: 2017-03-21 | Disposition: A | Discharge status: Home / Self Care | Payer: Commercial Managed Care - PPO | Attending: Emergency Medicine | Admitting: Emergency Medicine

## 2017-03-21 ENCOUNTER — Encounter (HOSPITAL_COMMUNITY): Payer: Self-pay | Admitting: *Deleted

## 2017-03-21 DIAGNOSIS — K2900 Acute gastritis without bleeding: Secondary | ICD-10-CM | POA: Insufficient documentation

## 2017-03-21 DIAGNOSIS — R1013 Epigastric pain: Secondary | ICD-10-CM | POA: Diagnosis present

## 2017-03-21 DIAGNOSIS — F17210 Nicotine dependence, cigarettes, uncomplicated: Secondary | ICD-10-CM | POA: Diagnosis not present

## 2017-03-21 LAB — COMPREHENSIVE METABOLIC PANEL
ALBUMIN: 4 g/dL (ref 3.5–5.0)
ALT: 20 U/L (ref 17–63)
ANION GAP: 9 (ref 5–15)
AST: 24 U/L (ref 15–41)
Alkaline Phosphatase: 76 U/L (ref 38–126)
BUN: 12 mg/dL (ref 6–20)
CO2: 23 mmol/L (ref 22–32)
Calcium: 9.2 mg/dL (ref 8.9–10.3)
Chloride: 104 mmol/L (ref 101–111)
Creatinine, Ser: 0.81 mg/dL (ref 0.61–1.24)
GFR calc Af Amer: >60 mL/min (ref >60)
GFR calc non Af Amer: >60 mL/min (ref >60)
GLUCOSE: 109 mg/dL — AB (ref 65–99)
POTASSIUM: 3.8 mmol/L (ref 3.5–5.1)
SODIUM: 136 mmol/L (ref 135–145)
TOTAL PROTEIN: 7.2 g/dL (ref 6.5–8.1)
Total Bilirubin: 1 mg/dL (ref 0.3–1.2)

## 2017-03-21 LAB — URINALYSIS, ROUTINE W REFLEX MICROSCOPIC
BILIRUBIN URINE: NEGATIVE
Glucose, UA: NEGATIVE mg/dL
HGB URINE DIPSTICK: NEGATIVE
KETONES UR: 5 mg/dL — AB
LEUKOCYTES UA: NEGATIVE
NITRITE: NEGATIVE
PROTEIN: 30 mg/dL — AB
Specific Gravity, Urine: 1.026 (ref 1.005–1.030)
pH: 5 (ref 5.0–8.0)

## 2017-03-21 LAB — CBC
HEMATOCRIT: 42.5 % (ref 39.0–52.0)
Hemoglobin: 13.8 g/dL (ref 13.0–17.0)
MCH: 29.4 pg (ref 26.0–34.0)
MCHC: 32.5 g/dL (ref 30.0–36.0)
MCV: 90.4 fL (ref 78.0–100.0)
Platelets: 409 10*3/uL — ABNORMAL HIGH (ref 150–400)
RBC: 4.7 MIL/uL (ref 4.22–5.81)
RDW: 12.7 % (ref 11.5–15.5)
WBC: 9.8 10*3/uL (ref 4.0–10.5)

## 2017-03-21 LAB — LIPASE, BLOOD: Lipase: 38 U/L (ref 11–51)

## 2017-03-21 MED ORDER — GI COCKTAIL ~~LOC~~
30.0000 mL | Freq: Once | ORAL | Status: AC
  Administered 2017-03-21: 30 mL via ORAL
  Filled 2017-03-21: qty 30

## 2017-03-21 MED ORDER — SUCRALFATE 1 GM/10ML PO SUSP: 1.0000 g | Freq: Three times a day (TID) | ORAL | 0 refills | Status: AC

## 2017-03-21 MED ORDER — PANTOPRAZOLE SODIUM 20 MG PO TBEC: 20.0000 mg | DELAYED_RELEASE_TABLET | Freq: Two times a day (BID) | ORAL | 0 refills | Status: DC

## 2017-03-21 NOTE — ED Provider Notes (Signed)
[facility]PT Provider Note   CSN: [id number] Arrival date & time: 03/21/17  1536     History   Chief Complaint Chief Complaint  Patient presents with  . Abdominal Pain    HPI Anthony Weiss is a 46 y.o. male.  HPI Patient presents with 4-6 months of epigastric pain radiating into his lower chest. Describes the pain as burning. Worse after eating. Associated with some nausea and episodic vomiting. Denies any coffee-ground emesis. No melanotic or grossly bloody stools. For chills. Patient states he's been taking Protonix as prescribed however he continues to use NSAIDs regularly. Has not followed up with a gastroenterologist. History reviewed. No pertinent past medical history.  There are no active problems to display for this patient.   Past Surgical History:  Procedure Laterality Date  . HERNIA REPAIR         Home Medications    Prior to Admission medications   Medication Sig Start Date End Date Taking? Authorizing Provider  doxycycline (VIBRAMYCIN) 50 MG capsule Take 1 capsule (50 mg total) by mouth 2 (two) times daily. Patient not taking: Reported on 08/20/2016 01/31/13   Street, Stephanie Coup, MD  pantoprazole (PROTONIX) 20 MG tablet Take 1 tablet (20 mg total) by mouth 2 (two) times daily. 03/21/17   Loren Racer, MD  sucralfate (CARAFATE) 1 GM/10ML suspension Take 10 mLs (1 g total) by mouth 4 (four) times daily -  with meals and at bedtime. 03/21/17   Loren Racer, MD    Family History No family history on file.  Social History Social History  Substance Use Topics  . Smoking status: Current Every Day Smoker    Packs/day: 0.50    Types: Cigarettes  . Smokeless tobacco: Never Used  . Alcohol use Yes     Comment: occ     Allergies   Patient has no known allergies.   Review of Systems Review of Systems  Constitutional: Negative for chills, fatigue and fever.  Respiratory: Negative for cough and shortness of breath.   Cardiovascular: Negative  for chest pain.  Gastrointestinal: Positive for abdominal pain, nausea and vomiting. Negative for blood in stool, constipation and diarrhea.  Genitourinary: Negative for dysuria, flank pain, frequency and hematuria.  Musculoskeletal: Negative for back pain, myalgias, neck pain and neck stiffness.  Skin: Negative for rash and wound.  Neurological: Negative for dizziness, weakness, light-headedness, numbness and headaches.  All other systems reviewed and are negative.    Physical Exam Updated Vital Signs BP 109/75 (BP Location: Left Arm)   Pulse 73   Temp 98.2 F (36.8 C) (Oral)   Resp 18   Ht  (1.88 m)   Wt 117.9 kg (260 lb)   SpO2 96%   BMI 33.38 kg/m   Physical Exam  Constitutional: He is oriented to person, place, and time. He appears well-developed and well-nourished. No distress.  Patient is sleeping on stretcher. No acute distress.  HENT:  Head: Normocephalic and atraumatic.  Mouth/Throat: Oropharynx is clear and moist. No oropharyngeal exudate.  Eyes: Pupils are equal, round, and reactive to light. EOM are normal.  Neck: Normal range of motion. Neck supple.  Cardiovascular: Normal rate and regular rhythm.   Pulmonary/Chest: Effort normal and breath sounds normal.  Abdominal: Soft. Bowel sounds are normal. There is tenderness (mild epigastric tenderness to palpation.). There is no rebound and no guarding.  Musculoskeletal: Normal range of motion. He exhibits no edema or tenderness.  No CVA tenderness.  Neurological: He is alert and oriented  to person, place, and time.  Skin: Skin is warm and dry. No rash noted. No erythema.  Psychiatric: He has a normal mood and affect. His behavior is normal.  Nursing note and vitals reviewed.    ED Treatments / Results  Labs (all labs ordered are listed, but only abnormal results are displayed) Labs Reviewed  COMPREHENSIVE METABOLIC PANEL - Abnormal; Notable for the following:       Result Value   Glucose, Bld 109 (*)     All other components within normal limits  CBC - Abnormal; Notable for the following:    Platelets 409 (*)    All other components within normal limits  URINALYSIS, ROUTINE W REFLEX MICROSCOPIC - Abnormal; Notable for the following:    Ketones, ur 5 (*)    Protein, ur 30 (*)    Bacteria, UA RARE (*)    Squamous Epithelial / LPF 0-5 (*)    All other components within normal limits  LIPASE, BLOOD    EKG  EKG Interpretation None       Radiology No results found.  Procedures Procedures (including critical care time)  Medications Ordered in ED Medications  gi cocktail (Maalox,Lidocaine,Donnatal) (not administered)     Initial Impression / Assessment and Plan / ED Course  I have reviewed the triage vital signs and the nursing notes.  Pertinent labs & imaging results that were available during my care of the patient were reviewed by me and considered in my medical decision making (see chart for details).     Encouraged avoid all NSAIDs. Discussed at length dietary items to avoid. I stressed the need to follow-up with a gastroenterologist. Return precautions given.  Final Clinical Impressions(s) / ED Diagnoses   Final diagnoses:  Acute gastritis without hemorrhage, unspecified gastritis type    New Prescriptions Current Discharge Medication List       Loren RacerYelverton, Gaylynn Seiple, MD 03/21/17 2201

## 2017-03-21 NOTE — ED Triage Notes (Signed)
Pt c/o abd pain for 4 months  He has been seen  In random areas  He has n v no diarrhea

## 2017-03-21 NOTE — Discharge Instructions (Signed)
Avoid all NSAIDs including naproxen, ibuprofen, indomethacin, aspirin. You may only take Tylenol for pain. You may use over-the-counter Mylanta or Maalox. Avoid alcohol, spicy or acidic foods. Make an appointment to follow-up with gastroenterologist.

## 2018-02-02 ENCOUNTER — Emergency Department (HOSPITAL_COMMUNITY)
Admission: EM | Admit: 2018-02-02 | Discharge: 2018-02-02 | Disposition: A | Discharge status: Home / Self Care | Payer: Self-pay | Attending: Emergency Medicine | Admitting: Emergency Medicine

## 2018-02-02 ENCOUNTER — Other Ambulatory Visit: Payer: Self-pay

## 2018-02-02 ENCOUNTER — Encounter (HOSPITAL_COMMUNITY): Payer: Self-pay | Admitting: Emergency Medicine

## 2018-02-02 ENCOUNTER — Emergency Department (HOSPITAL_COMMUNITY): Payer: Self-pay

## 2018-02-02 DIAGNOSIS — Y929 Unspecified place or not applicable: Secondary | ICD-10-CM | POA: Insufficient documentation

## 2018-02-02 DIAGNOSIS — W19XXXA Unspecified fall, initial encounter: Secondary | ICD-10-CM

## 2018-02-02 DIAGNOSIS — S93401A Sprain of unspecified ligament of right ankle, initial encounter: Secondary | ICD-10-CM | POA: Insufficient documentation

## 2018-02-02 DIAGNOSIS — S52514A Nondisplaced fracture of right radial styloid process, initial encounter for closed fracture: Secondary | ICD-10-CM | POA: Insufficient documentation

## 2018-02-02 DIAGNOSIS — Y9301 Activity, walking, marching and hiking: Secondary | ICD-10-CM | POA: Insufficient documentation

## 2018-02-02 DIAGNOSIS — W108XXA Fall (on) (from) other stairs and steps, initial encounter: Secondary | ICD-10-CM | POA: Insufficient documentation

## 2018-02-02 DIAGNOSIS — Y999 Unspecified external cause status: Secondary | ICD-10-CM | POA: Insufficient documentation

## 2018-02-02 DIAGNOSIS — F17210 Nicotine dependence, cigarettes, uncomplicated: Secondary | ICD-10-CM | POA: Insufficient documentation

## 2018-02-02 DIAGNOSIS — Z79899 Other long term (current) drug therapy: Secondary | ICD-10-CM | POA: Insufficient documentation

## 2018-02-02 MED ORDER — MELOXICAM 7.5 MG PO TABS: 7.5000 mg | ORAL_TABLET | Freq: Every day | ORAL | 0 refills | Status: AC

## 2018-02-02 NOTE — Discharge Instructions (Signed)
Wear splints except for bathing.  At home apply ice for 20 minutes at a time and elevate to help with pain and swelling. You may take Tylenol as needed as directed for pain.  Take meloxicam as prescribed for pain or take Motrin as needed for pain.

## 2018-02-02 NOTE — ED Notes (Signed)
Patient ambulated to restroom.  Gait steady, but limping

## 2018-02-02 NOTE — ED Notes (Signed)
Ortho tech made aware of need for splinting

## 2018-02-02 NOTE — ED Notes (Signed)
Ortho at bedside at this time.

## 2018-02-02 NOTE — ED Provider Notes (Signed)
[facility] EMERGENCY DEPARTMENT Provider Note   CSN: [id number] Arrival date & time: 02/02/18  0920     History   Chief Complaint Chief Complaint  Patient presents with  . Wrist Pain  . Ankle Pain  . Fall    HPI Anthony Weiss is a 47 y.o. male.  47 year old male presents with injuries from a fall that occurred 2 weeks ago.  Patient states that he was walking down steps when the brick step broke causing him to fall landing on his outstretched right hand and twisting his right ankle.  Patient has continued to work despite his injuries, states that by the end of his shift his right ankle is very swollen and painful.  Right wrist is tender to the touch and hurts to move.  No other injuries, complaints, concerns.     History reviewed. No pertinent past medical history.  There are no active problems to display for this patient.   Past Surgical History:  Procedure Laterality Date  . HERNIA REPAIR          Home Medications    Prior to Admission medications   Medication Sig Start Date End Date Taking? Authorizing Provider  doxycycline (VIBRAMYCIN) 50 MG capsule Take 1 capsule (50 mg total) by mouth 2 (two) times daily. Patient not taking: Reported on 08/20/2016 01/31/13   Street, Stephanie Coup, MD  meloxicam (MOBIC) 7.5 MG tablet Take 1 tablet (7.5 mg total) by mouth daily for 10 days. 02/02/18 02/12/18  Jeannie Fend, PA-C  pantoprazole (PROTONIX) 20 MG tablet Take 1 tablet (20 mg total) by mouth 2 (two) times daily. 03/21/17   Loren Racer, MD  sucralfate (CARAFATE) 1 GM/10ML suspension Take 10 mLs (1 g total) by mouth 4 (four) times daily -  with meals and at bedtime. 03/21/17   Loren Racer, MD    Family History No family history on file.  Social History Social History   Tobacco Use  . Smoking status: Current Every Day Smoker    Packs/day: 0.50    Types: Cigarettes  . Smokeless tobacco: Never Used  Substance Use Topics  . Alcohol use: Yes    Comment: occ  . Drug use: No     Allergies   Patient has no known allergies.   Review of Systems Review of Systems  Constitutional: Negative for fever.  Musculoskeletal: Positive for arthralgias, joint swelling and myalgias. Negative for back pain, gait problem, neck pain and neck stiffness.  Skin: Negative for wound.  Allergic/Immunologic: Negative for immunocompromised state.  Neurological: Negative for dizziness, weakness and numbness.  Hematological: Does not bruise/bleed easily.  Psychiatric/Behavioral: Negative for confusion.  All other systems reviewed and are negative.    Physical Exam Updated Vital Signs BP (!) 130/91 (BP Location: Right Arm)   Pulse 96   Temp 98.6 F (37 C) (Oral)   Resp 16   Ht 6\' 2"  (1.88 m)   Wt 115.7 kg (255 lb)   SpO2 99%   BMI 32.74 kg/m   Physical Exam  Constitutional: He is oriented to person, place, and time. He appears well-developed and well-nourished. No distress.  HENT:  Head: Normocephalic and atraumatic.  Cardiovascular: Intact distal pulses.  Pulmonary/Chest: Effort normal.  Musculoskeletal: He exhibits tenderness and deformity.       Right wrist: He exhibits decreased range of motion, tenderness, bony tenderness and deformity. He exhibits no swelling, no effusion and no crepitus.       Right ankle: He exhibits decreased  range of motion. He exhibits no swelling, no ecchymosis, no deformity, no laceration and normal pulse. Tenderness. Lateral malleolus, medial malleolus and head of 5th metatarsal tenderness found. No proximal fibula tenderness found.       Arms: Neurological: He is alert and oriented to person, place, and time.  Skin: Skin is warm and dry. No rash noted. He is not diaphoretic.  Psychiatric: He has a normal mood and affect. His behavior is normal.  Nursing note and vitals reviewed.    ED Treatments / Results  Labs (all labs ordered are listed, but only abnormal results are displayed) Labs Reviewed - No  data to display  EKG None  Radiology Dg Wrist Complete Right  Result Date: 02/02/2018 CLINICAL DATA:  Fall 2 weeks ago. Right wrist pain. Right hand weakness. Soft tissue nodule. EXAM: RIGHT WRIST - COMPLETE 3+ VIEW COMPARISON:  None. FINDINGS: The wrist is located. Soft tissue swelling is present along the radial aspect of the wrist. There is irregularity at the radial styloid with a small bone fragment. This likely represents a healing fracture. Carpal bones are otherwise within normal limits. IMPRESSION: 1. Nondisplaced healing radial styloid fracture with associated soft tissue swelling Electronically Signed   By: Marin Robertshristopher  Mattern M.D.   On: 02/02/2018 10:38   Dg Ankle Complete Right  Result Date: 02/02/2018 CLINICAL DATA:  Pain following recent fall EXAM: RIGHT ANKLE - COMPLETE 3+ VIEW COMPARISON:  None. FINDINGS: Frontal, oblique, and lateral views obtained. There is no fracture or joint effusion. There is spurring along the anterior aspect of the ankle joint with mild narrowing medially. No erosive change. There is a spur arising from the inferior calcaneus. Ankle mortise appears intact. IMPRESSION: Areas of arthropathy. Calcaneal spur inferiorly. No acute fracture. Ankle mortise appears intact. Electronically Signed   By: Bretta BangWilliam  Woodruff III M.D.   On: 02/02/2018 10:39   Dg Foot Complete Right  Result Date: 02/02/2018 CLINICAL DATA:  Pain following fall EXAM: RIGHT FOOT COMPLETE - 3+ VIEW COMPARISON:  None. FINDINGS: Frontal, oblique, and lateral views were obtained. There is pes planus. There is an inferior calcaneal spur. There is no appreciable fracture or dislocation. No appreciable joint space narrowing or erosion. There is slight spurring in the talonavicular joint region. IMPRESSION: Slight spurring in the talonavicular joint. Inferior calcaneal spur. Pes planus. No fracture or dislocation.  No erosive change. Electronically Signed   By: Bretta BangWilliam  Woodruff III M.D.   On:  02/02/2018 10:53    Procedures Procedures (including critical care time)  Medications Ordered in ED Medications - No data to display   Initial Impression / Assessment and Plan / ED Course  I have reviewed the triage vital signs and the nursing notes.  Pertinent labs & imaging results that were available during my care of the patient were reviewed by me and considered in my medical decision making (see chart for details).  Clinical Course as of Feb 02 1126  Tue Feb 02, 2018  [id number] 47 year old male presents with injuries from a fall which occurred 2 weeks ago.  Patient reports pain in his right wrist and right ankle.  X-ray of the right wrist shows a nondisplaced right radial styloid fracture.  Patient will be placed in a Velcro splint and referred to orthopedics for follow-up on this.  X-ray of the right ankle does not show fracture, suspect sprain due to the pain and swelling that he has.  Patient was placed in a lace up splint, he declines crutches.  Given  prescription for meloxicam for pain, recommend ice and elevate for pain and swelling and follow-up with orthopedics.   [LM]    Clinical Course User Index [LM] Jeannie Fend, PA-C    Final Clinical Impressions(s) / ED Diagnoses   Final diagnoses:  Fall, initial encounter  Closed nondisplaced fracture of styloid process of right radius, initial encounter  Sprain of right ankle, unspecified ligament, initial encounter    ED Discharge Orders        Ordered    meloxicam (MOBIC) 7.5 MG tablet  Daily     02/02/18 1123       Alden Hipp 02/02/18 1127    Cathren Laine, MD 02/02/18 1255

## 2018-02-02 NOTE — ED Triage Notes (Signed)
Pt. Stated, I fell 2 weeks ago and hurt my rt.wrist and rt. Ankle. It still hurts

## 2018-02-02 NOTE — ED Notes (Signed)
Patient able to ambulate independently  

## 2018-02-04 NOTE — ED Notes (Signed)
Discharge callback completed to review visit with patient 1612 02/04/2018

## 2019-03-12 IMAGING — DX DG ANKLE COMPLETE 3+V*R*
3 series · 3 of 3 positions shown · non-contrast
Comparison: None.

CLINICAL DATA: Pain following recent fall

EXAM:
RIGHT ANKLE - COMPLETE 3+ VIEW

[x ankle ap right]
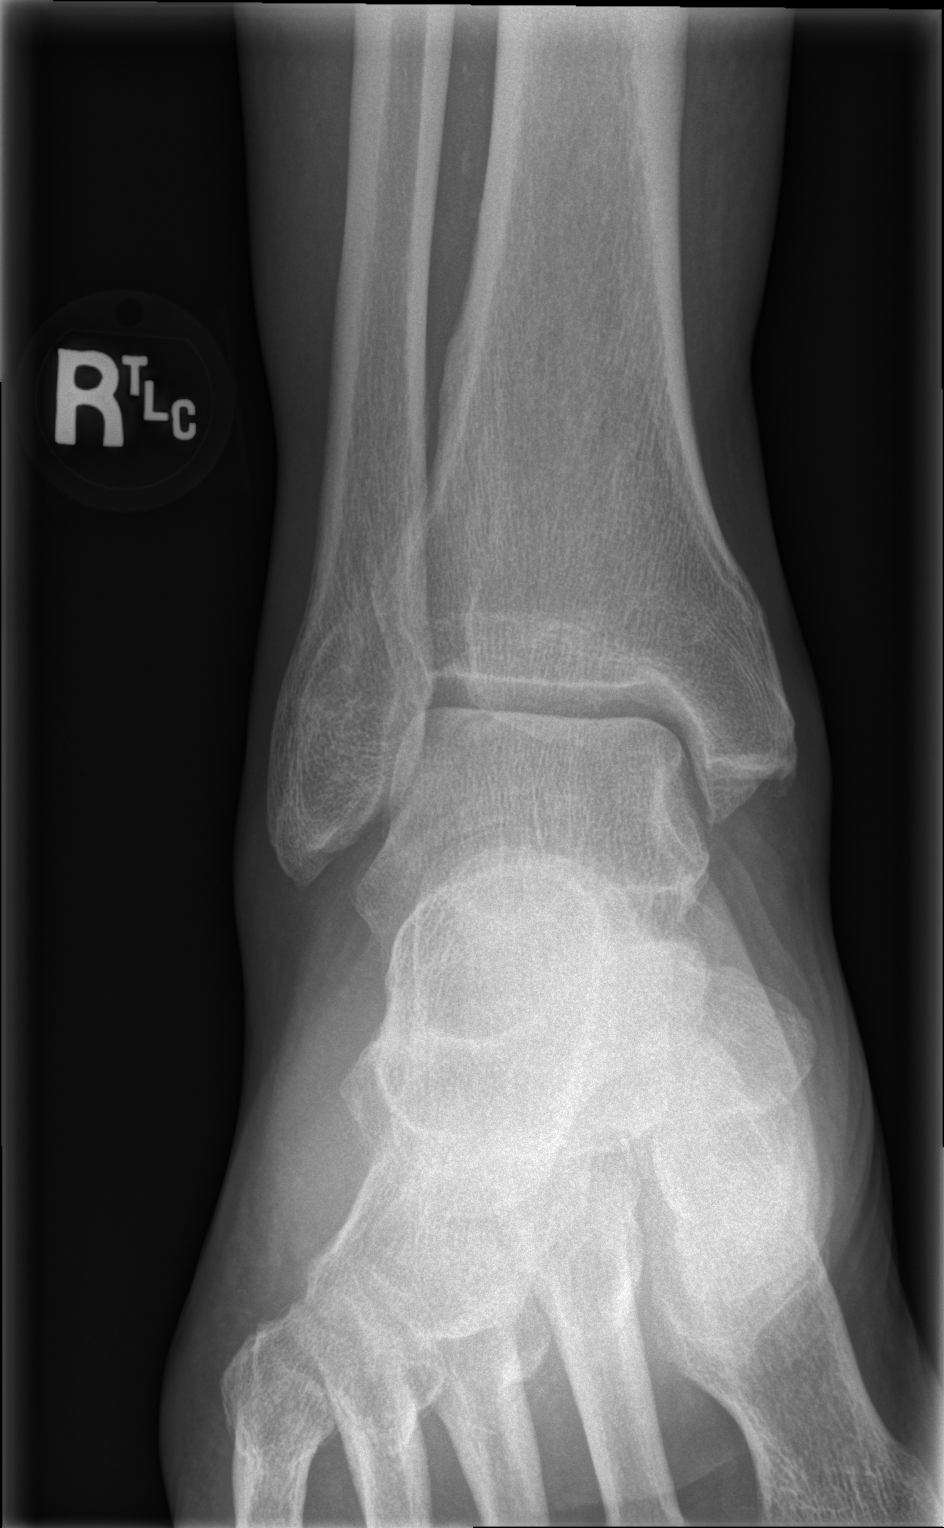

[x ankle obl right]
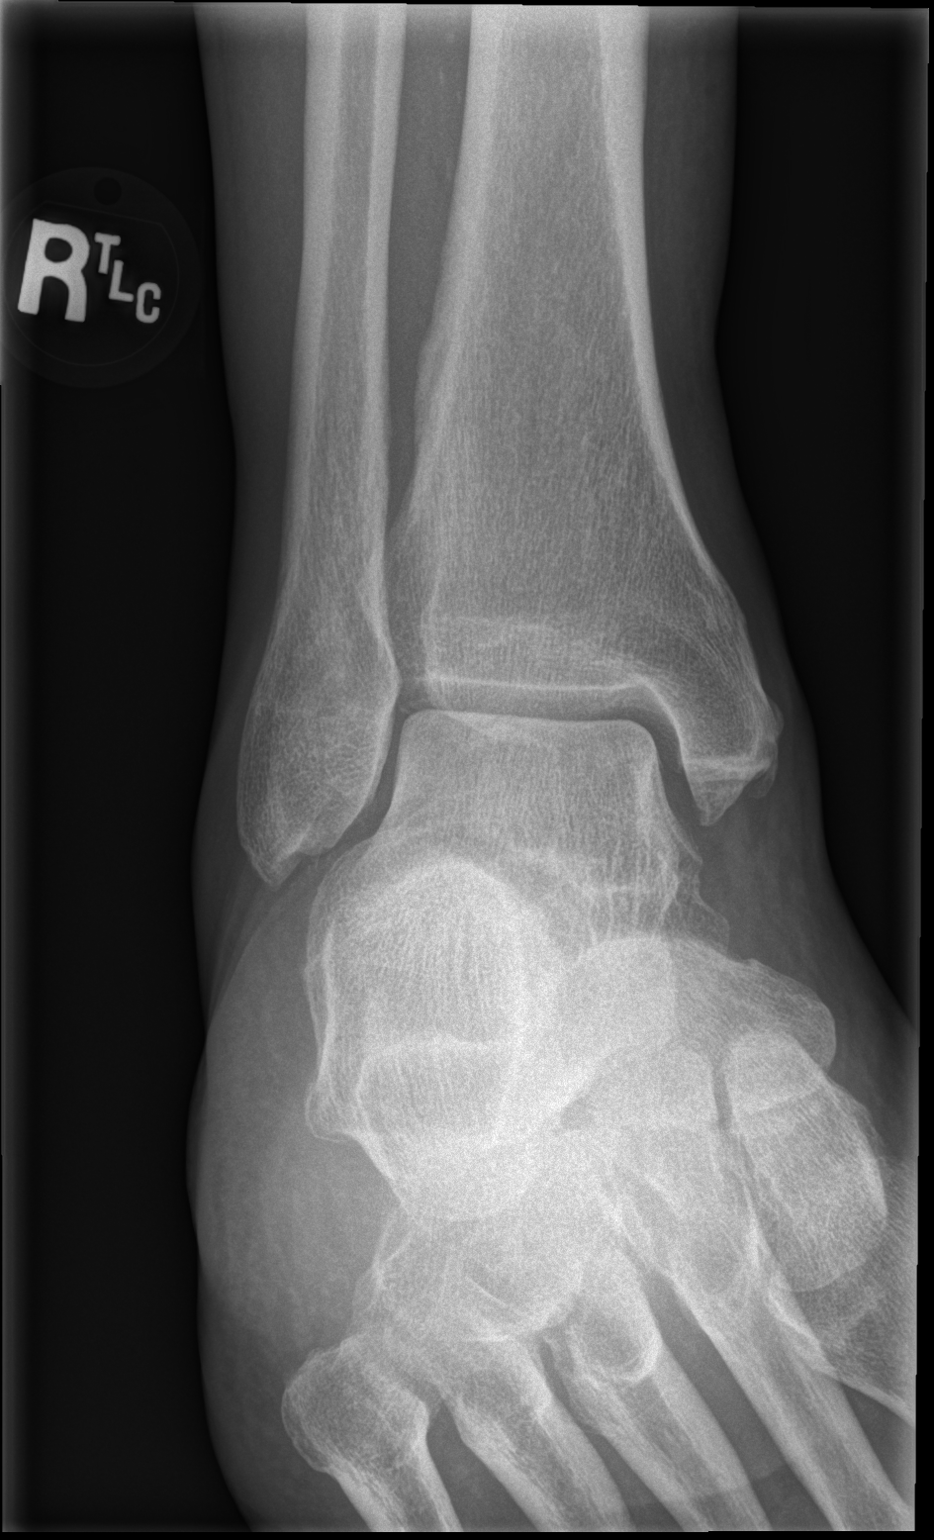

[x ankle lat right]
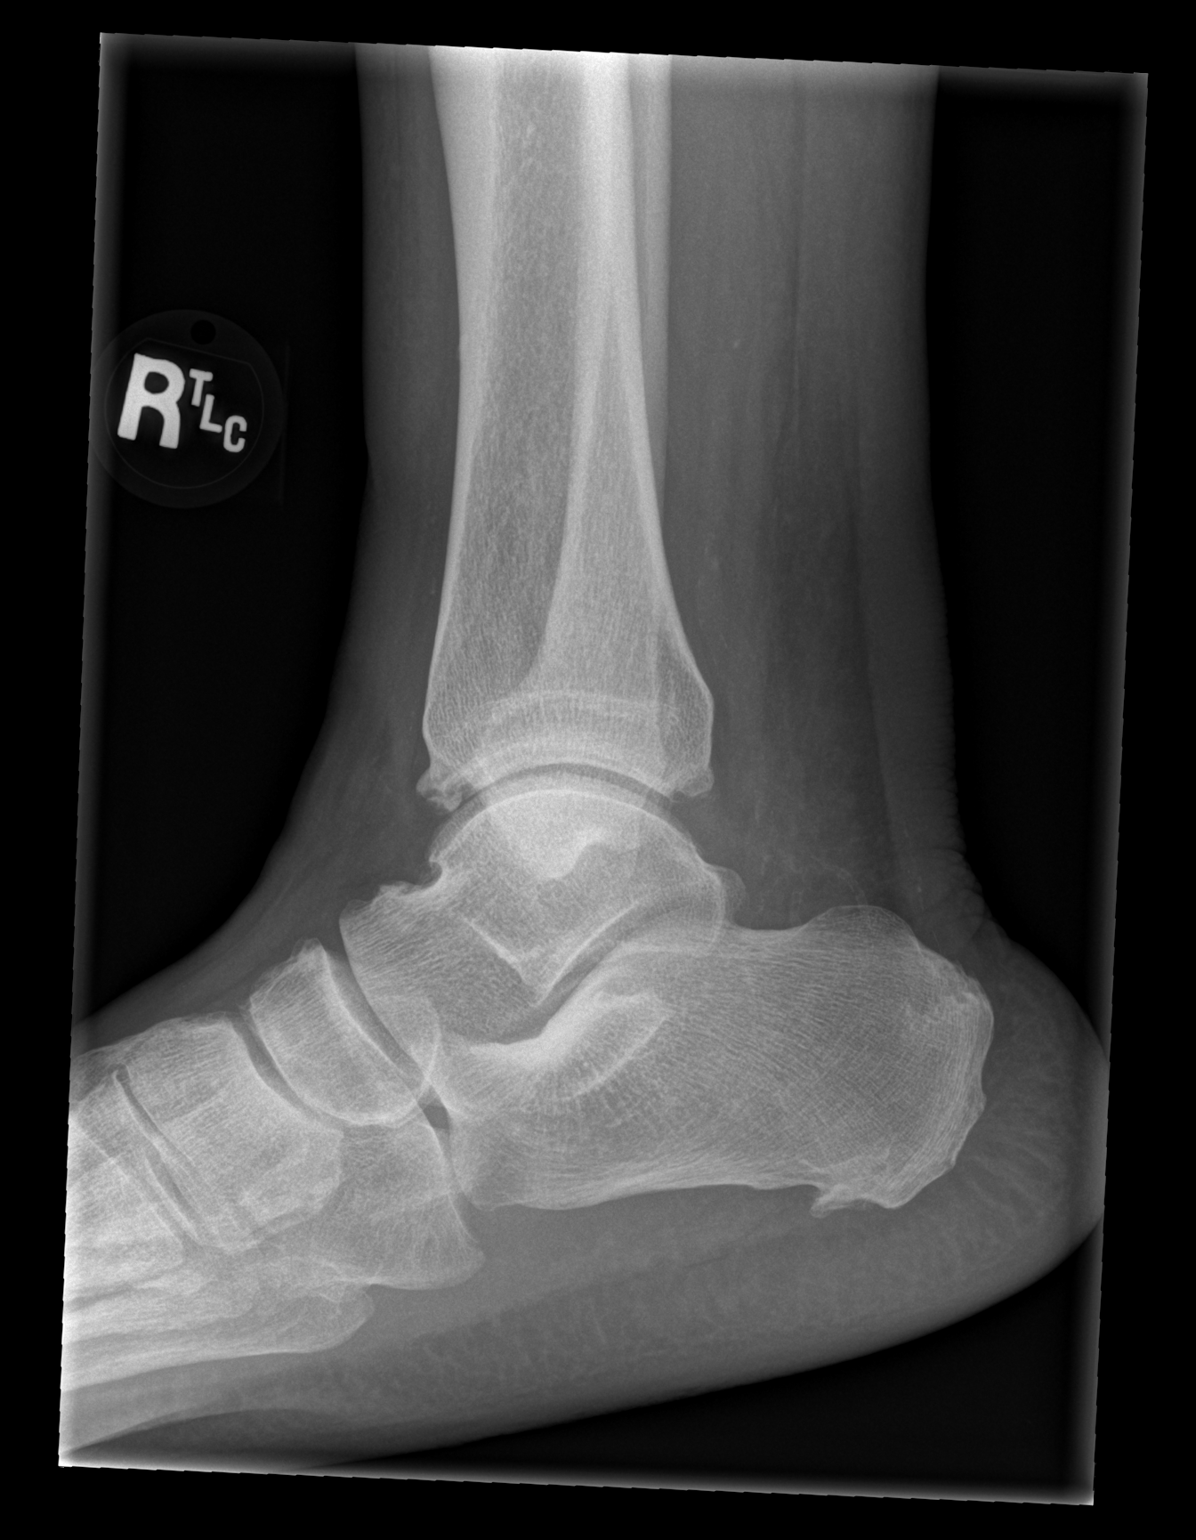

[3 of 3 positions shown; findings below may reference images not displayed]

FINDINGS: Frontal, oblique, and lateral views obtained. There is no fracture
or joint effusion. There is spurring along the anterior aspect of
the ankle joint with mild narrowing medially. No erosive change.
There is a spur arising from the inferior calcaneus. Ankle mortise
appears intact.
IMPRESSION: Areas of arthropathy. Calcaneal spur inferiorly. No acute fracture.
Ankle mortise appears intact.

## 2019-03-12 IMAGING — DX DG WRIST COMPLETE 3+V*R*
4 series · 4 of 4 positions shown · non-contrast
Comparison: None.

CLINICAL DATA: Fall 2 weeks ago. Right wrist pain. Right hand
weakness. Soft tissue nodule.

EXAM:
RIGHT WRIST - COMPLETE 3+ VIEW

[x wrist pa right]
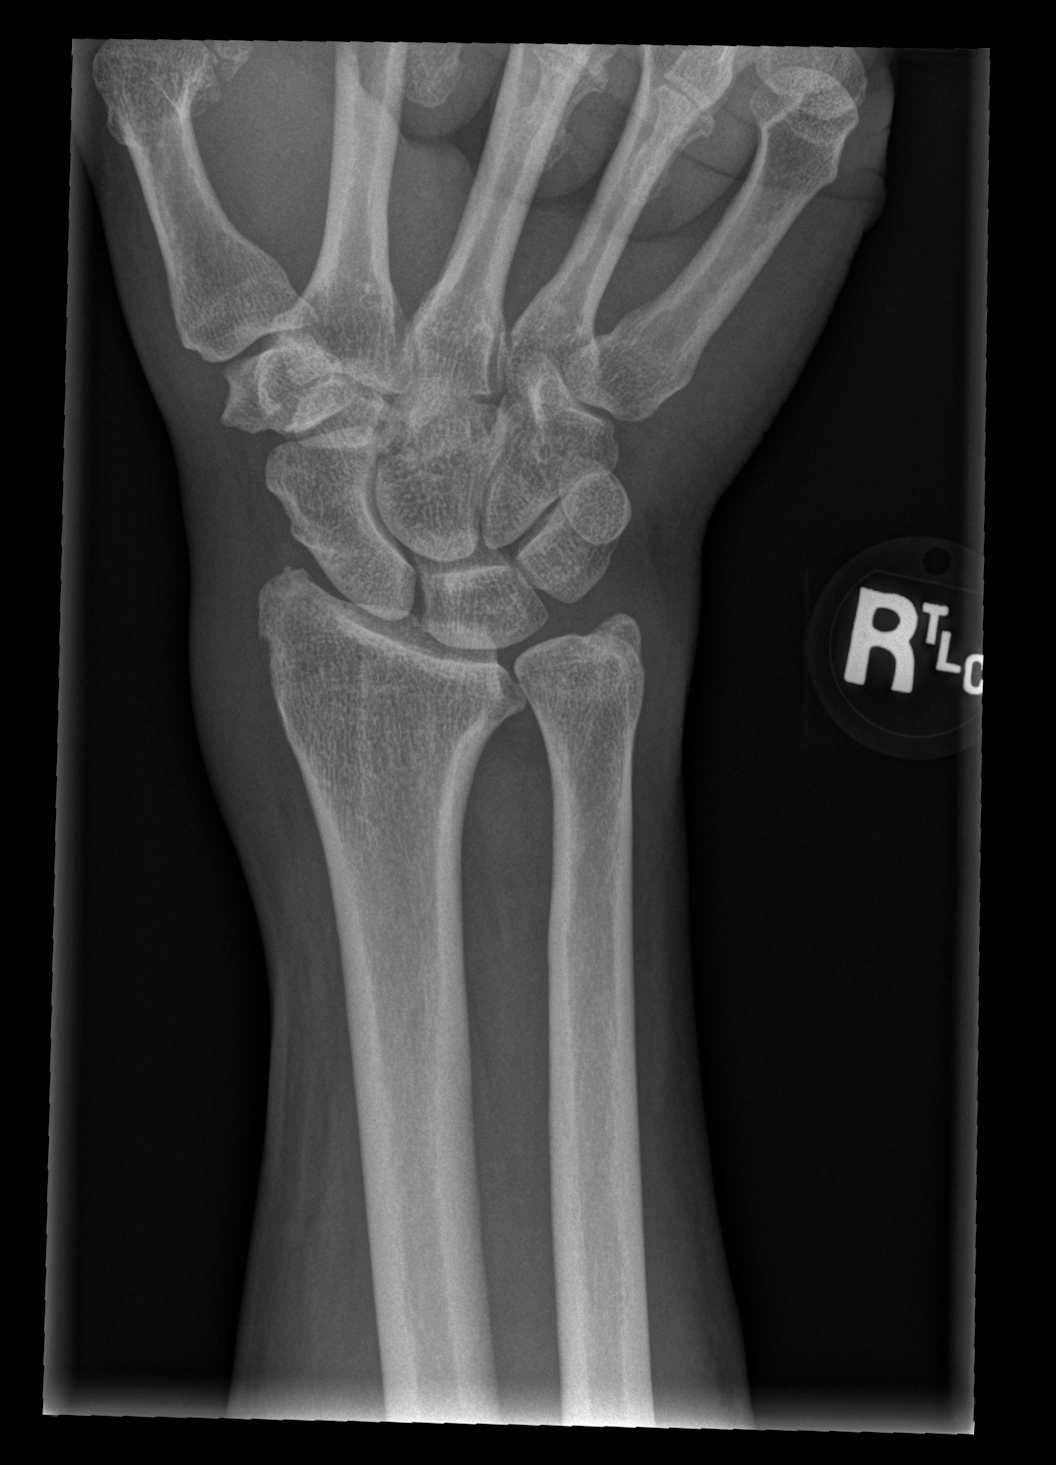

[x wrist obl right]
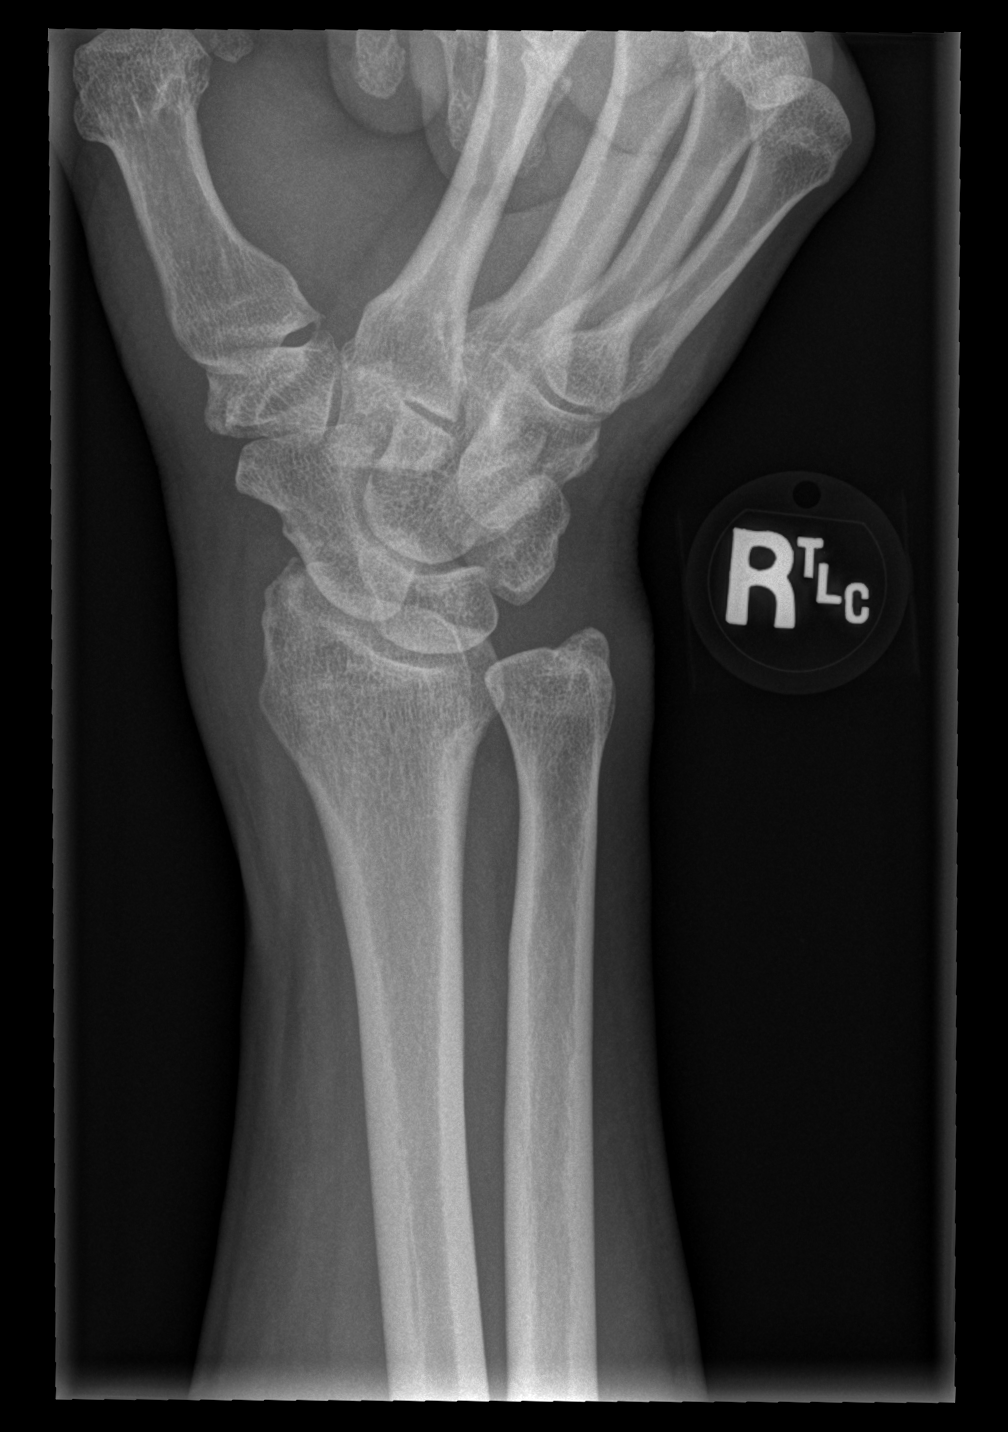

[x wrist lat right]
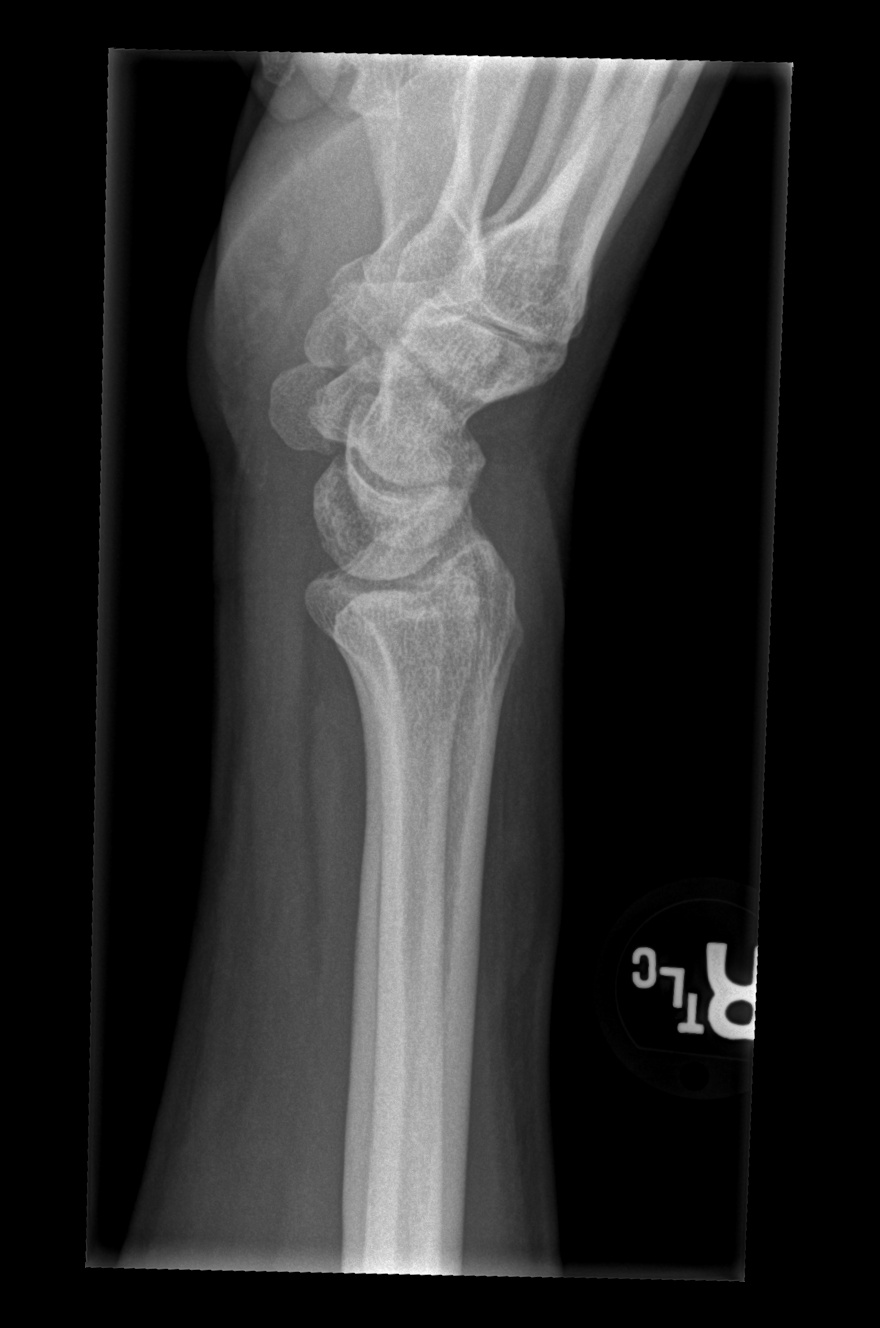

[x wrist navicular view right]
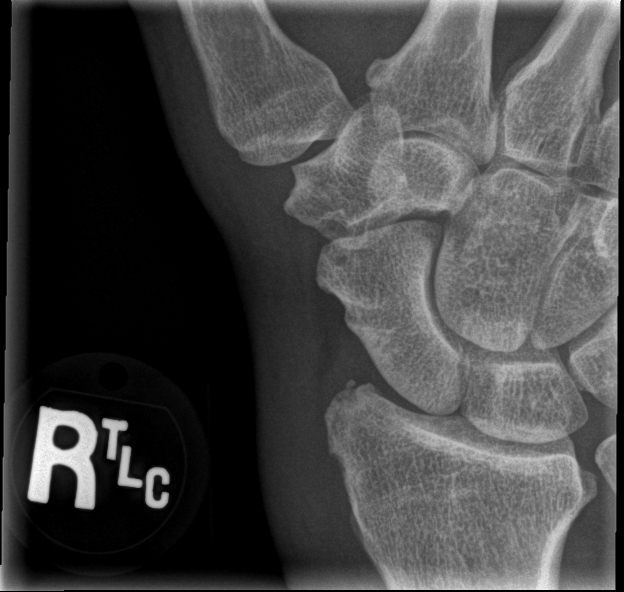

[4 of 4 positions shown; findings below may reference images not displayed]

FINDINGS: The wrist is located. Soft tissue swelling is present along the
radial aspect of the wrist. There is irregularity at the radial
styloid with a small bone fragment. This likely represents a healing
fracture.

Carpal bones are otherwise within normal limits.
IMPRESSION: 1. Nondisplaced healing radial styloid fracture with associated soft
tissue swelling

## 2022-10-06 ENCOUNTER — Other Ambulatory Visit: Payer: Self-pay | Admitting: Internal Medicine

## 2022-10-06 DIAGNOSIS — I10 Essential (primary) hypertension: Secondary | ICD-10-CM

## 2022-11-06 ENCOUNTER — Ambulatory Visit: Admission: RE | Admit: 2022-11-06 | Payer: Self-pay | Source: Ambulatory Visit

## 2023-07-27 ENCOUNTER — Ambulatory Visit (INDEPENDENT_AMBULATORY_CARE_PROVIDER_SITE_OTHER): Payer: Self-pay

## 2023-07-27 ENCOUNTER — Ambulatory Visit: Payer: Commercial Managed Care - PPO | Admitting: Podiatry

## 2023-07-27 ENCOUNTER — Encounter: Payer: Self-pay | Admitting: Podiatry

## 2023-07-27 DIAGNOSIS — S92901D Unspecified fracture of right foot, subsequent encounter for fracture with routine healing: Secondary | ICD-10-CM

## 2023-07-27 DIAGNOSIS — M778 Other enthesopathies, not elsewhere classified: Secondary | ICD-10-CM

## 2023-07-27 MED ORDER — TRIAMCINOLONE ACETONIDE 10 MG/ML IJ SUSP
10.0000 mg | Freq: Once | INTRAMUSCULAR | Status: AC
  Administered 2023-07-27: 10 mg via INTRA_ARTICULAR

## 2023-07-29 NOTE — Progress Notes (Signed)
 Subjective:   Patient ID: Anthony Weiss, male   DOB: 53 y.o.   MRN: [id number]   HPI Patient presents with a lot of pain around his right forefoot with chronic lesion on the outside that is been sore history of fracture of the bone and no other pathology currently.  He does smoke a half a pack per day and tries to be active   Review of Systems  All other systems reviewed and are negative.       Objective:  Physical Exam Vitals and nursing note reviewed.  Constitutional:      Appearance: He is well-developed.  Pulmonary:     Effort: Pulmonary effort is normal.  Musculoskeletal:        General: Normal range of motion.  Skin:    General: Skin is warm.  Neurological:     Mental Status: He is alert.     Neurovascular status intact muscle strength adequate range of motion adequate inflammation around the right fifth MPJ fluid buildup around the joint and mild keratotic tissue formation bilateral     Assessment:  Inflammatory capsulitis base of fifth MPJ right along with lesion consistent with pressure     Plan:  H&P reviewed condition educated him on chronic lesion disease and nail disease and also I went ahead did sterile prep injected the fifth MPJ plantar lateral 3 mg Dexasone Kenalog  5 mg Xylocaine  debrided all lesions reappoint as symptoms indicate may require more aggressive approach  X-rays were negative for signs of pathology with indications fracture fifth metatarsal right that is healed well

## 2024-04-13 ENCOUNTER — Emergency Department (HOSPITAL_COMMUNITY)
Admission: EM | Admit: 2024-04-13 | Discharge: 2024-04-13 | Disposition: A | Discharge status: Home / Self Care | Payer: Self-pay | Attending: Emergency Medicine | Admitting: Emergency Medicine

## 2024-04-13 ENCOUNTER — Other Ambulatory Visit: Payer: Self-pay

## 2024-04-13 DIAGNOSIS — L0591 Pilonidal cyst without abscess: Secondary | ICD-10-CM | POA: Insufficient documentation

## 2024-04-13 MED ORDER — LIDOCAINE-EPINEPHRINE (PF) 2 %-1:200000 IJ SOLN
10.0000 mL | Freq: Once | INTRAMUSCULAR | Status: AC
  Administered 2024-04-13: 10 mL
  Filled 2024-04-13: qty 20

## 2024-04-13 MED ORDER — DOXYCYCLINE HYCLATE 100 MG PO CAPS: 100.0000 mg | ORAL_CAPSULE | Freq: Two times a day (BID) | ORAL | 0 refills | Status: AC

## 2024-04-13 MED ORDER — TETANUS-DIPHTH-ACELL PERTUSSIS 5-2.5-18.5 LF-MCG/0.5 IM SUSY
0.5000 mL | PREFILLED_SYRINGE | Freq: Once | INTRAMUSCULAR | Status: AC
  Administered 2024-04-13: 0.5 mL via INTRAMUSCULAR
  Filled 2024-04-13: qty 0.5

## 2024-04-13 MED ORDER — NAPROXEN 500 MG PO TABS: 500.0000 mg | ORAL_TABLET | Freq: Two times a day (BID) | ORAL | 0 refills | Status: AC

## 2024-04-13 MED ORDER — TETANUS-DIPHTH-ACELL PERTUSSIS 5-2-15.5 LF-MCG/0.5 IM SUSP
0.5000 mL | Freq: Once | INTRAMUSCULAR | Status: DC
  Filled 2024-04-13: qty 0.5

## 2024-04-13 NOTE — ED Triage Notes (Signed)
 Pt. Stated, I've had a bump on my butt since Saturday

## 2024-04-13 NOTE — ED Provider Notes (Signed)
 [place] EMERGENCY DEPARTMENT AT [facility] Provider Note   CSN: [id number] Arrival date & time: 04/13/24  9182     Patient presents with: Abscess   Anthony Weiss is a 53 y.o. male.    Abscess Patient is a 53 year old male presenting ED today for concerns for gluteal abscess present for the last 2 days.  Noted to have had history of this previously.  Has had difficulty sitting starting yesterday.  Unsure when last tetanus shot was says that it was not within the last 5 years.   Denies fever, body aches, chills, headache, vision changes, chest pain, shortness of breath, abdominal pain, nausea, vomiting, diarrhea, medic easy, melena, dysuria, hematuria, lower leg swelling.     Prior to Admission medications   Medication Sig Start Date End Date Taking? Authorizing Provider  doxycycline  (VIBRAMYCIN ) 100 MG capsule Take 1 capsule (100 mg total) by mouth 2 (two) times daily. 04/13/24  Yes Beola Terrall RAMAN, PA-C  naproxen  (NAPROSYN ) 500 MG tablet Take 1 tablet (500 mg total) by mouth 2 (two) times daily. 04/13/24  Yes Beola Terrall RAMAN, PA-C  sucralfate  (CARAFATE ) 1 GM/10ML suspension Take 10 mLs (1 g total) by mouth 4 (four) times daily -  with meals and at bedtime. 03/21/17   Carlyle Lenis, MD    Allergies: Patient has no known allergies.    Review of Systems  Updated Vital Signs BP (!) 151/94   Pulse (!) 107   Temp 98.6 F (37 C)   Resp 18   Ht 6' 2 (1.88 m)   Wt 120.2 kg   SpO2 95%   BMI 34.02 kg/m   Physical Exam  (all labs ordered are listed, but only abnormal results are displayed) Labs Reviewed - No data to display  EKG: None  Radiology: No results found.  .Incision and Drainage  Date/Time: 04/13/2024 10:35 AM  Performed by: Beola Terrall RAMAN, PA-C Authorized by: Beola Terrall RAMAN, PA-C   Consent:    Consent obtained:  Verbal and written   Consent given by:  Patient   Risks discussed:  Bleeding, damage to other organs, infection, incomplete  drainage and pain   Alternatives discussed:  No treatment and delayed treatment Universal protocol:    Procedure explained and questions answered to patient or proxy's satisfaction: yes     Relevant documents present and verified: yes     Site/side marked: yes     Immediately prior to procedure, a time out was called: yes     Patient identity confirmed:  Verbally with patient and arm band Location:    Type:  Pilonidal cyst   Size:  3.5   Location:  Anogenital   Anogenital location:  Pilonidal Pre-procedure details:    Skin preparation:  Chlorhexidine with alcohol and povidone-iodine Sedation:    Sedation type:  None Anesthesia:    Anesthesia method:  Local infiltration   Local anesthetic:  Lidocaine  2% WITH epi Procedure type:    Complexity:  Simple Procedure details:    Ultrasound guidance: no     Needle aspiration: yes     Needle size:  22 G   Wound management:  Probed and deloculated   Drainage:  Bloody   Drainage amount:  Scant   Wound treatment:  Wound left open   Packing materials:  None Post-procedure details:    Procedure completion:  Tolerated well, no immediate complications    Medications Ordered in the ED  lidocaine -EPINEPHrine  (XYLOCAINE  W/EPI) 2 %-1:200000 (PF) injection 10 mL (  10 mLs Infiltration Given 04/13/24 0953)  Tdap (BOOSTRIX) injection 0.5 mL (0.5 mLs Intramuscular Given 04/13/24 0953)     Medical Decision Making  This patient is a 53 year old male who presents to the ED for concern of pilonidal abscess, noted to have previous treated abscesses.  Noted to have approximately 3.5 cm area of induration tender to palpation.  With previous history of abscesses in the similar area and this feeling similar to previous.  On physical exam, patient is in no acute distress, afebrile, alert and orient x 4, speaking in full sentences, nontachypneic, nontachycardic.  Area of induration was noted and cleaned with iodine and chlorhexidine.  Area was aspirated with  unable to any aspiration.  Small incision was made noting  small amount of thick discharge present with mostly bloody discharge noted otherwise.  With patient's current incision, likely pilonidal cyst as cause of patient symptoms today.  Will send home on antibiotics as well as following up with general surgery and to provide information to establish care with PCP.  Provided return ER precautions.  Patient vital signs have remained stable throughout the course of patient's time in the ED. Low suspicion for any other emergent pathology at this time. I believe this patient is safe to be discharged. Provided strict return to ER precautions. Patient expressed agreement and understanding of plan. All questions were answered.  Differential diagnoses prior to evaluation: The emergent differential diagnosis includes, but is not limited to, abscess, pilonidal cyst, fistula, necrotizing fasciitis, cellulitis, dermatitis. This is not an exhaustive differential.   Past Medical History / Co-morbidities / Social History: Pilonidal abscess  Additional history: Chart reviewed. Pertinent results include:   Last known to have been seen for abscess in the emergency department in 2014 reporting history of previous abscesses.    Medications: I ordered medication including doxycycline , naproxen .  I have reviewed the patients home medicines and have made adjustments as needed.  Critical Interventions: None  Social Determinants of Health: Does not have a PCP  Disposition: After consideration of the diagnostic results and the patients response to treatment, I feel that the patient would benefit from discharge and treatment as above.   emergency department workup does not suggest an emergent condition requiring admission or immediate intervention beyond what has been performed at this time. The plan is: Follow-up with general surgery, follow-up with PCP, return to the ED for new or worsening symptoms, antibiotics.  The patient is safe for discharge and has been instructed to return immediately for worsening symptoms, change in symptoms or any other concerns.   Final diagnoses:  Pilonidal cyst    ED Discharge Orders          Ordered    naproxen  (NAPROSYN ) 500 MG tablet  2 times daily        04/13/24 1046    doxycycline  (VIBRAMYCIN ) 100 MG capsule  2 times daily        04/13/24 [address], [place] 04/13/24 1050    Armenta Canning, MD 04/14/24 2040

## 2024-04-13 NOTE — Discharge Instructions (Addendum)
 You are seen today for a likely pilonidal cyst.  Recommend you continue to go to the general surgery for evaluation of this.  I am sending home antibiotics with you as well as anti-inflammatory to help with pain.  You can additionally take Tylenol  as needed.  Take Tylenol  (acetominophen)  650mg  every 4-6 hours, as needed for pain or fever. Do not take more than 4,000 mg in a 24-hour period. As this may cause liver damage. While this is rare, if you begin to develop yellowing of the skin or eyes, stop taking and return to ER immediately.    Also send you home with antibiotics.  Please take till completion.   Please take Naprosyn , 500mg  by mouth twice daily as needed for pain - this in an antiinflammatory medicine (NSAID) and is similar to ibuprofen - many people feel that it is stronger than ibuprofen and it is easier to take since it is a smaller pill.  Please use this only for 1 week - if your pain persists, you will need to follow up with your doctor in the office for ongoing guidance and pain control.    Recommend you continue to follow-up with PCP.  I have attached 1 information to this after visit summary.  However you do not have to go to this he can go to any other PCP.  If you do not have access to go to PCP.  Recommend you follow-up with the health department   Return to ED if concerning new or worsening symptoms include uncontrolled pain, fever, pain with urination or bowel movement.
